# Patient Record
Sex: Male | Born: 1980 | Race: Black or African American | Hispanic: No | Marital: Married | Smoking: Never smoker
Health system: Southern US, Community
[De-identification: ages and names within clinical notes are randomized; demographics above are authoritative.]

## PROBLEM LIST (undated history)

## (undated) DIAGNOSIS — N200 Calculus of kidney: Secondary | ICD-10-CM

## (undated) DIAGNOSIS — E785 Hyperlipidemia, unspecified: Secondary | ICD-10-CM

## (undated) HISTORY — DX: Calculus of kidney: N20.0

## (undated) HISTORY — PX: NO PAST SURGERIES: SHX2092

---

## 2010-12-25 DIAGNOSIS — N200 Calculus of kidney: Secondary | ICD-10-CM

## 2010-12-25 HISTORY — DX: Calculus of kidney: N20.0

## 2011-11-09 ENCOUNTER — Emergency Department (HOSPITAL_COMMUNITY)
Admission: EM | Admit: 2011-11-09 | Discharge: 2011-11-09 | Disposition: A | Discharge status: Home / Self Care | Payer: Self-pay | Attending: Emergency Medicine | Admitting: Emergency Medicine

## 2011-11-09 ENCOUNTER — Emergency Department (HOSPITAL_COMMUNITY): Payer: Self-pay

## 2011-11-09 ENCOUNTER — Encounter: Payer: Self-pay | Admitting: Emergency Medicine

## 2011-11-09 DIAGNOSIS — R1031 Right lower quadrant pain: Secondary | ICD-10-CM | POA: Insufficient documentation

## 2011-11-09 DIAGNOSIS — N201 Calculus of ureter: Secondary | ICD-10-CM | POA: Insufficient documentation

## 2011-11-09 DIAGNOSIS — J45909 Unspecified asthma, uncomplicated: Secondary | ICD-10-CM | POA: Insufficient documentation

## 2011-11-09 DIAGNOSIS — N200 Calculus of kidney: Secondary | ICD-10-CM

## 2011-11-09 LAB — BASIC METABOLIC PANEL
BUN: 8 mg/dL (ref 6–23)
Creatinine, Ser: 1.15 mg/dL (ref 0.50–1.35)
GFR calc Af Amer: >90 mL/min (ref >90)
GFR calc non Af Amer: 85 mL/min — ABNORMAL LOW (ref >90)
Potassium: 3.3 mEq/L — ABNORMAL LOW (ref 3.5–5.1)

## 2011-11-09 LAB — DIFFERENTIAL
Basophils Absolute: 0 10*3/uL (ref 0.0–0.1)
Basophils Relative: 0 % (ref 0–1)
Eosinophils Absolute: 0.2 10*3/uL (ref 0.0–0.7)
Monocytes Relative: 9 % (ref 3–12)
Neutrophils Relative %: 66 % (ref 43–77)

## 2011-11-09 LAB — URINALYSIS, ROUTINE W REFLEX MICROSCOPIC
Bilirubin Urine: NEGATIVE
Ketones, ur: NEGATIVE mg/dL
Nitrite: NEGATIVE
Specific Gravity, Urine: 1.024 (ref 1.005–1.030)
Urobilinogen, UA: 1 mg/dL (ref 0.0–1.0)

## 2011-11-09 LAB — CBC
Hemoglobin: 13.7 g/dL (ref 13.0–17.0)
MCH: 28.7 pg (ref 26.0–34.0)
MCHC: 33.7 g/dL (ref 30.0–36.0)
RDW: 13.6 % (ref 11.5–15.5)

## 2011-11-09 MED ORDER — ONDANSETRON HCL 4 MG/2ML IJ SOLN
4.0000 mg | Freq: Once | INTRAMUSCULAR | Status: AC
  Administered 2011-11-09: 4 mg via INTRAVENOUS
  Filled 2011-11-09: qty 2

## 2011-11-09 MED ORDER — HYDROMORPHONE HCL PF 1 MG/ML IJ SOLN
1.0000 mg | Freq: Once | INTRAMUSCULAR | Status: AC
  Administered 2011-11-09: 1 mg via INTRAVENOUS
  Filled 2011-11-09: qty 1

## 2011-11-09 MED ORDER — ONDANSETRON HCL 4 MG PO TABS: 4.0000 mg | ORAL_TABLET | Freq: Four times a day (QID) | ORAL | Status: AC

## 2011-11-09 MED ORDER — IBUPROFEN 800 MG PO TABS: 800.0000 mg | ORAL_TABLET | Freq: Three times a day (TID) | ORAL | Status: AC

## 2011-11-09 MED ORDER — SODIUM CHLORIDE 0.9 % IV BOLUS (SEPSIS)
500.0000 mL | Freq: Once | INTRAVENOUS | Status: AC
  Administered 2011-11-09: 500 mL via INTRAVENOUS

## 2011-11-09 MED ORDER — KETOROLAC TROMETHAMINE 30 MG/ML IJ SOLN
30.0000 mg | Freq: Once | INTRAMUSCULAR | Status: AC
  Administered 2011-11-09: 30 mg via INTRAVENOUS
  Filled 2011-11-09: qty 1

## 2011-11-09 MED ORDER — OXYCODONE-ACETAMINOPHEN 5-325 MG PO TABS: ORAL_TABLET | ORAL | Status: AC

## 2011-11-09 NOTE — ED Notes (Signed)
Pt states he feel much better, rates pain 1/10, pending disposition

## 2011-11-09 NOTE — ED Provider Notes (Signed)
History     CSN: [id number] Arrival date & time: 11/09/2011 10:03 AM   First MD Initiated Contact with Patient 11/09/11 1006      Chief Complaint  Patient presents with  . Abdominal Pain    RLQ pain started an hour ago, non radiating    (Consider location/radiation/quality/duration/timing/severity/associated sxs/prior treatment) HPI  Patient who has no known medical problems, takes no medicine on a regular basis, and denies any history of abdominal surgeries presents to emergency department by EMS with complaint of acute onset right lower quadrant pain. Patient was given 50 mcg of fentanyl in route by EMS with only mild improvement in pain with ongoing constant pain. Patient states he was at work and had a normal bowel movement, denying pain with bowel movement or blood in his stool and returned to work. He states that approximately 15-20 minutes later he had acute onset right lower quadrant pain. Patient denies aggravating or alleviating factors. Patient denies history of similar pain. He denies fevers, chills, nausea, vomiting, diarrhea, dysuria, hematuria, blood in stool. Patient denies any preceding abdominal pain. He denies testicular pain or penile discharge.  Past Medical History  Diagnosis Date  . Asthma     History reviewed. No pertinent past surgical history.  History reviewed. No pertinent family history.  History  Substance Use Topics  . Smoking status: Never Smoker   . Smokeless tobacco: Not on file  . Alcohol Use: Yes     occassionally      Review of Systems  All other systems reviewed and are negative.    Allergies  Sulfa drugs cross reactors  Home Medications  No current outpatient prescriptions on file.  BP 97/57  Pulse 66  Temp(Src) 97.4 F (36.3 C) (Oral)  Resp 16  Ht 5\' 9"  (1.753 m)  Wt 185 lb (83.915 kg)  BMI 27.32 kg/m2  SpO2 96%  Physical Exam  Nursing note and vitals reviewed. Constitutional: He is oriented to person, place, and  time. He appears well-developed and well-nourished. No distress.  HENT:  Head: Normocephalic and atraumatic.  Eyes: Conjunctivae are normal.  Neck: Normal range of motion. Neck supple.  Cardiovascular: Normal rate, regular rhythm, normal heart sounds and intact distal pulses.  Exam reveals no gallop and no friction rub.   No murmur heard. Pulmonary/Chest: Effort normal and breath sounds normal. No respiratory distress. He has no wheezes. He has no rales. He exhibits no tenderness.  Abdominal: Bowel sounds are normal. He exhibits no distension and no mass. There is no tenderness. There is no rigidity, no rebound, no guarding, no CVA tenderness, no tenderness at McBurney's point and negative Murphy's sign. Hernia confirmed negative in the right inguinal area and confirmed negative in the left inguinal area.  Genitourinary: Testes normal and penis normal. Right testis shows no mass, no swelling and no tenderness. Right testis is descended. Cremasteric reflex is not absent on the right side. Left testis shows no mass, no swelling and no tenderness. Left testis is descended. Cremasteric reflex is not absent on the left side.  Musculoskeletal: Normal range of motion. He exhibits no edema and no tenderness.  Neurological: He is alert and oriented to person, place, and time.  Skin: Skin is warm and dry. No rash noted. He is not diaphoretic. No erythema.  Psychiatric: He has a normal mood and affect.    ED Course  Procedures (including critical care time)  IV Dilaudid and Zofran.  Patient is complaining of ongoing pain however his abdomen is  soft nontender with palpation. No peritoneal signs. No tenderness to palpation of testes bilaterally.  Labs Reviewed  CBC - Abnormal; Notable for the following:    WBC 11.2 (*)    All other components within normal limits  BASIC METABOLIC PANEL - Abnormal; Notable for the following:    Potassium 3.3 (*)    Glucose, Bld 114 (*)    GFR calc non Af Amer 85 (*)     All other components within normal limits  DIFFERENTIAL  URINALYSIS, ROUTINE W REFLEX MICROSCOPIC   Ct Abdomen Pelvis Wo Contrast  11/09/2011  *RADIOLOGY REPORT*  Clinical Data: Right lower quadrant abdominal pain  CT ABDOMEN AND PELVIS WITHOUT CONTRAST  Technique:  Multidetector CT imaging of the abdomen and pelvis was performed following the standard protocol without intravenous contrast.  Comparison: None.  Findings: Lung bases are clear.  No pericardial fluid.  No focal hepatic lesion on this noncontrast exam.  The gallbladder, pancreas, spleen, adrenal glands are normal.  Three nonobstructing calculi within the right kidney measuring 1 to 3 mm.  There is a low density cystic lesion within the mid right renal cortex which cannot be fully characterized (image 32).  There are three nonobstructing calculi within the left kidney ranging in size from 1 to 3 mm.  No evidence of ureterolithiasis on the left. There is a calculus within the right distal ureter approximately 1 cm from the vesicoureteral junction measuring 3 mm (image 76).  The stomach, small bowel appendix, and cecum are normal.  The colon and rectosigmoid colon are normal.  Abdominal aorta normal caliber.  No retroperitoneal lymphadenopathy.  Prostate gland is normal.  No bladder stones.  No pelvic lymphadenopathy.  Review of  bone windows demonstrates no aggressive osseous lesions.   IMPRESSION:  1.  Small partial obstructing calculus within the distal right ureter approximately 1 cm from the ureterovesicular junction. 2.  Bilateral small nonobstructing nephrolithiasis.  Original Report Authenticated By: Genevive Bi, M.D.   After second dose of IV Dilaudid as well as a dose of IV Toradol patient states he is now pain free.  1. Kidney stone       MDM  Pt is afebrile, abdomen soft and nontender with no peritoneal signs. CT scan showing a small kidney stone with no complicating factors. Patient voices understanding to follow up with  urology as needed for ongoing pain but to return to [facility] long emergency room for emergent changing or worsening of symptoms.        Jenness Corner, [place] 11/09/11 1407

## 2011-11-09 NOTE — ED Notes (Signed)
Pt undressed and placed in gown. Pts belongings in bag at the bedside.

## 2011-11-09 NOTE — ED Notes (Signed)
Per ems, pt stated after using bathroom he had an acute onset of abdominal pain (RLQ), pt received with 20g to LFA, pt medicated by ems with 50 mcg of Fentayl for pain 10/10, patient rates pain presently 5/10.

## 2011-11-10 NOTE — ED Provider Notes (Signed)
Medical screening examination/treatment/procedure(s) were performed by non-physician practitioner and as supervising physician I was immediately available for consultation/collaboration.   Wyvonne Carda R Breyton Vanscyoc, MD 11/10/11 1847 

## 2014-03-23 ENCOUNTER — Emergency Department (HOSPITAL_COMMUNITY): Payer: BC Managed Care – PPO

## 2014-03-23 ENCOUNTER — Emergency Department (HOSPITAL_COMMUNITY)
Admission: EM | Admit: 2014-03-23 | Discharge: 2014-03-24 | Disposition: A | Discharge status: Home / Self Care | Payer: BC Managed Care – PPO | Attending: Emergency Medicine | Admitting: Emergency Medicine

## 2014-03-23 ENCOUNTER — Encounter (HOSPITAL_COMMUNITY): Payer: Self-pay | Admitting: Emergency Medicine

## 2014-03-23 DIAGNOSIS — R630 Anorexia: Secondary | ICD-10-CM | POA: Insufficient documentation

## 2014-03-23 DIAGNOSIS — J45909 Unspecified asthma, uncomplicated: Secondary | ICD-10-CM | POA: Insufficient documentation

## 2014-03-23 DIAGNOSIS — E669 Obesity, unspecified: Secondary | ICD-10-CM | POA: Insufficient documentation

## 2014-03-23 DIAGNOSIS — R112 Nausea with vomiting, unspecified: Secondary | ICD-10-CM | POA: Insufficient documentation

## 2014-03-23 DIAGNOSIS — Z79899 Other long term (current) drug therapy: Secondary | ICD-10-CM | POA: Insufficient documentation

## 2014-03-23 DIAGNOSIS — N200 Calculus of kidney: Secondary | ICD-10-CM | POA: Insufficient documentation

## 2014-03-23 LAB — URINALYSIS, ROUTINE W REFLEX MICROSCOPIC
Bilirubin Urine: NEGATIVE
GLUCOSE, UA: NEGATIVE mg/dL
Hgb urine dipstick: NEGATIVE
Ketones, ur: 15 mg/dL — AB
NITRITE: NEGATIVE
PH: 7.5 (ref 5.0–8.0)
Protein, ur: 30 mg/dL — AB
SPECIFIC GRAVITY, URINE: 1.029 (ref 1.005–1.030)
Urobilinogen, UA: 1 mg/dL (ref 0.0–1.0)

## 2014-03-23 LAB — URINE MICROSCOPIC-ADD ON

## 2014-03-23 LAB — COMPREHENSIVE METABOLIC PANEL
ALBUMIN: 4.3 g/dL (ref 3.5–5.2)
ALK PHOS: 43 U/L (ref 39–117)
ALT: 30 U/L (ref 0–53)
AST: 29 U/L (ref 0–37)
BILIRUBIN TOTAL: 0.2 mg/dL — AB (ref 0.3–1.2)
BUN: 10 mg/dL (ref 6–23)
CHLORIDE: 102 meq/L (ref 96–112)
CO2: 24 meq/L (ref 19–32)
CREATININE: 1.47 mg/dL — AB (ref 0.50–1.35)
Calcium: 9.4 mg/dL (ref 8.4–10.5)
GFR calc Af Amer: 71 mL/min — ABNORMAL LOW (ref >90)
GFR, EST NON AFRICAN AMERICAN: 62 mL/min — AB (ref >90)
Glucose, Bld: 109 mg/dL — ABNORMAL HIGH (ref 70–99)
POTASSIUM: 4.6 meq/L (ref 3.7–5.3)
Sodium: 142 mEq/L (ref 137–147)
Total Protein: 7.9 g/dL (ref 6.0–8.3)

## 2014-03-23 LAB — CBC WITH DIFFERENTIAL/PLATELET
BASOS PCT: 0 % (ref 0–1)
Basophils Absolute: 0 10*3/uL (ref 0.0–0.1)
Eosinophils Absolute: 0 10*3/uL (ref 0.0–0.7)
Eosinophils Relative: 0 % (ref 0–5)
HEMATOCRIT: 42 % (ref 39.0–52.0)
HEMOGLOBIN: 14.4 g/dL (ref 13.0–17.0)
LYMPHS PCT: 5 % — AB (ref 12–46)
Lymphs Abs: 0.5 10*3/uL — ABNORMAL LOW (ref 0.7–4.0)
MCH: 29.1 pg (ref 26.0–34.0)
MCHC: 34.3 g/dL (ref 30.0–36.0)
MCV: 84.8 fL (ref 78.0–100.0)
MONO ABS: 0.3 10*3/uL (ref 0.1–1.0)
MONOS PCT: 3 % (ref 3–12)
NEUTROS ABS: 9 10*3/uL — AB (ref 1.7–7.7)
NEUTROS PCT: 92 % — AB (ref 43–77)
Platelets: 259 10*3/uL (ref 150–400)
RBC: 4.95 MIL/uL (ref 4.22–5.81)
RDW: 14.2 % (ref 11.5–15.5)
WBC: 9.8 10*3/uL (ref 4.0–10.5)

## 2014-03-23 MED ORDER — SODIUM CHLORIDE 0.9 % IV BOLUS (SEPSIS)
1000.0000 mL | Freq: Once | INTRAVENOUS | Status: AC
  Administered 2014-03-23: 1000 mL via INTRAVENOUS

## 2014-03-23 MED ORDER — OXYCODONE-ACETAMINOPHEN 5-325 MG PO TABS
1.0000 | ORAL_TABLET | Freq: Once | ORAL | Status: AC
  Administered 2014-03-23: 1 via ORAL
  Filled 2014-03-23: qty 1

## 2014-03-23 MED ORDER — HYDROMORPHONE HCL PF 1 MG/ML IJ SOLN
1.0000 mg | Freq: Once | INTRAMUSCULAR | Status: AC
  Administered 2014-03-23: 1 mg via INTRAVENOUS
  Filled 2014-03-23: qty 1

## 2014-03-23 MED ORDER — KETOROLAC TROMETHAMINE 30 MG/ML IJ SOLN
30.0000 mg | Freq: Once | INTRAMUSCULAR | Status: AC
  Administered 2014-03-23: 30 mg via INTRAVENOUS
  Filled 2014-03-23: qty 1

## 2014-03-23 MED ORDER — ONDANSETRON 4 MG PO TBDP
8.0000 mg | ORAL_TABLET | Freq: Once | ORAL | Status: AC
  Administered 2014-03-23: 8 mg via ORAL
  Filled 2014-03-23: qty 2

## 2014-03-23 NOTE — ED Notes (Signed)
Updated pt taken to Triage 4 for reassessment

## 2014-03-23 NOTE — ED Provider Notes (Signed)
CSN: [id number]     Arrival date & time 03/23/14  1439 History   First MD Initiated Contact with Patient 03/23/14 2014     Chief Complaint  Patient presents with  . Nephrolithiasis     (Consider location/radiation/quality/duration/timing/severity/associated sxs/prior Treatment) Patient is a 33 y.o. male presenting with abdominal pain. The history is provided by the patient.  Abdominal Pain Pain location:  L flank (L groin) Pain quality: sharp   Pain radiates to:  Does not radiate Pain severity:  Mild Onset quality:  Sudden Duration:  6 hours Timing:  Intermittent Progression:  Waxing and waning Chronicity:  Recurrent (states feels similar to Kidney stone pain he had 2 yearsa go) Context: not alcohol use, not diet changes, not medication withdrawal, not previous surgeries, not recent travel, not sick contacts and not trauma   Relieved by:  Nothing Worsened by:  Nothing tried Ineffective treatments:  None tried Associated symptoms: anorexia, nausea and vomiting   Associated symptoms: no chest pain, no chills, no constipation, no cough, no diarrhea, no dysuria, no fever, no hematuria, no shortness of breath and no sore throat   Associated symptoms comment:  Denies penis or testicle pain Risk factors: obesity   Risk factors: no alcohol abuse, not elderly, has not had multiple surgeries and no recent hospitalization     Past Medical History  Diagnosis Date  . Asthma    History reviewed. No pertinent past surgical history. History reviewed. No pertinent family history. History  Substance Use Topics  . Smoking status: Never Smoker   . Smokeless tobacco: Not on file  . Alcohol Use: Yes     Comment: occassionally    Review of Systems  Constitutional: Negative for fever, chills, activity change and appetite change.  HENT: Negative for congestion, rhinorrhea and sore throat.   Eyes: Negative for discharge and itching.  Respiratory: Negative for cough, shortness of breath and  wheezing.   Cardiovascular: Negative for chest pain.  Gastrointestinal: Positive for nausea, vomiting, abdominal pain and anorexia. Negative for diarrhea and constipation.  Genitourinary: Negative for dysuria, hematuria, decreased urine volume and difficulty urinating.  Skin: Negative for rash and wound.  Neurological: Negative for syncope, weakness and numbness.  All other systems reviewed and are negative.      Allergies  Sulfa drugs cross reactors  Home Medications   Current Outpatient Rx  Name  Route  Sig  Dispense  Refill  . ondansetron (ZOFRAN ODT) 4 MG disintegrating tablet   Oral   Take 1 tablet (4 mg total) by mouth every 8 (eight) hours as needed for nausea or vomiting.   30 tablet   0   . oxyCODONE-acetaminophen (PERCOCET) 7.5-325 MG per tablet   Oral   Take 1 tablet by mouth every 4 (four) hours as needed for pain.   30 tablet   0   . tamsulosin (FLOMAX) 0.4 MG CAPS capsule   Oral   Take 1 capsule (0.4 mg total) by mouth daily.   30 capsule   0    BP 106/66  Pulse 77  Temp(Src) 98 F (36.7 C)  Resp 20  SpO2 93% Physical Exam  Vitals reviewed. Constitutional: He is oriented to person, place, and time. He appears well-developed and well-nourished. No distress.  Obese male in NAD  HENT:  Head: Normocephalic and atraumatic.  Mouth/Throat: Oropharynx is clear and moist. No oropharyngeal exudate.  Eyes: Conjunctivae and EOM are normal. Pupils are equal, round, and reactive to light. Right eye exhibits no discharge.  Left eye exhibits no discharge. No scleral icterus.  Neck: Normal range of motion. Neck supple.  Cardiovascular: Normal rate, regular rhythm, normal heart sounds and intact distal pulses.  Exam reveals no gallop and no friction rub.   No murmur heard. Pulmonary/Chest: Effort normal and breath sounds normal. No respiratory distress. He has no wheezes. He has no rales.  Abdominal: Soft. He exhibits no distension and no mass. There is tenderness  (mild anterior L abd ttp, soft, no rigidity, neg mcburneys, neg murphys, no CVA ttp, no peritonitis).  Musculoskeletal: Normal range of motion.  Neurological: He is alert and oriented to person, place, and time. No cranial nerve deficit. He exhibits normal muscle tone. Coordination normal.  Skin: Skin is warm. No rash noted. He is not diaphoretic.    ED Course  Procedures (including critical care time) Labs Review Labs Reviewed  URINALYSIS, ROUTINE W REFLEX MICROSCOPIC - Abnormal; Notable for the following:    Ketones, ur 15 (*)    Protein, ur 30 (*)    Leukocytes, UA TRACE (*)    All other components within normal limits  CBC WITH DIFFERENTIAL - Abnormal; Notable for the following:    Neutrophils Relative % 92 (*)    Neutro Abs 9.0 (*)    Lymphocytes Relative 5 (*)    Lymphs Abs 0.5 (*)    All other components within normal limits  COMPREHENSIVE METABOLIC PANEL - Abnormal; Notable for the following:    Glucose, Bld 109 (*)    Creatinine, Ser 1.47 (*)    Total Bilirubin 0.2 (*)    GFR calc non Af Amer 62 (*)    GFR calc Af Amer 71 (*)    All other components within normal limits  URINE MICROSCOPIC-ADD ON   Imaging Review Ct Abdomen Pelvis Wo Contrast  03/23/2014   CLINICAL DATA:  Flank pain; history of renal calculi  EXAM: CT ABDOMEN AND PELVIS WITHOUT CONTRAST  TECHNIQUE: Multidetector CT imaging of the abdomen and pelvis was performed following the standard protocol without oral intravenous contrast material administration.  COMPARISON:  November 09, 2011  FINDINGS: On axial slice 12 series 3, there is a 2 mm nodular opacity in the lateral segment of the left lower lobe. Lung bases are otherwise clear.  There is evidence of fatty change in the liver. No focal liver lesions are identified on this noncontrast enhanced study. There is no appreciable biliary duct dilatation. Gallbladder wall does not appear thickened.  Spleen, pancreas, and adrenals appear normal.  There is a 5 x 4 mm  calculus in the upper pole of the right kidney. There are several 1-2 mm calculi in the mid right kidney. There is a 2 mm calculus in the lower pole of the right kidney. There is a focal area of decreased attenuation in the periphery of the mid right kidney measuring 1.2 x 0.9 cm which has attenuation values slightly higher than expected for simple cyst. There is an 8 x 8 mm simple cyst in the lower pole of the right kidney. There is no hydronephrosis on the right. On the left, there is a nonobstructing 5 x 4 mm calculus in the mid to lower pole region. There is no left renal mass. There is mild hydronephrosis on the left. There is a 3 mm calculus in the distal left ureter best seen on slice 80 series 2. There are several nearby phleboliths on the left which are near but separate from the left ureter.  In the pelvis, the  urinary bladder is midline with normal wall thickness. There is no pelvic mass or fluid collection. Appendix appears within normal limits.  There is no bowel obstruction.  No free air or portal venous air.  There is a small ventral hernia containing only fat.  There is no ascites, adenopathy, or abscess in the abdomen or pelvis. There is no evidence of abdominal aortic aneurysm. There are no blastic or lytic bone lesions.  IMPRESSION: 3 mm calculus distal left ureter causing mild hydronephrosis on the left. No other ureteral calculi on either side.  There are nonobstructing calculi in each kidney, more on the right than on the left.  There is a small mass in the mid right kidney which has attenuation values slightly higher than is expected with simple cyst. Renal ultrasound advised to further evaluate.  There is fatty liver.  There is a 2 mm nodular opacity in the lateral segment of the left lower lobe. This opacity should be followed based on Fleischner Society guidelines. If the patient is at high risk for bronchogenic carcinoma, follow-up chest CT at 1 year is recommended. If the patient is at  low risk, no follow-up is needed. This recommendation follows the consensus statement: Guidelines for Management of Small Pulmonary Nodules Detected on CT Scans: A Statement from the Fleischner Society as published in Radiology 2005; 237:395-400.  No bowel obstruction.  No abscess.  Appendix appears normal.   Electronically Signed   By: Bretta Bang M.D.   On: 03/23/2014 19:09     EKG Interpretation None      MDM   MDM: 33 y.o. AAM w/ PMHx of kidney stone with L abd and flank pain for past 6 hours. Pt sudden onset, sharp, intermittent. Pt with n/v as well, states feels similar to past kidney stones. Denies urinary sxs, f/c, chest pain,S OB. No GU pain. AFVSS, well appaering but in pain. LAbs, urine, CT scan obtained. Given initially toradol, then dilaudid with good relief of pain. Zofran given with no vomitng while in ED. Given 2 L IVF as well. CT shows 3 mm stone with mild hydro. BMP with Cr of 1.4, but patient clinically dry, will hydrate. Pt making good urine while in Ed. Urine not infected. No fever, no infected urine to suggest infected stone. Small stone, will liekly pass. Recommend hydration, pain and nausea control and ureteral dilation. Given flomax, percocet, zofran and recommend to drink large amount of water. Given Urology f/u. Pt feels better, will discahrge. Care of case d/w my attending.  Final diagnoses:  Kidney stone on left side    Discharged   Pilar Jarvis, MD 03/24/14 [phone]

## 2014-03-23 NOTE — ED Notes (Signed)
Per pt sts about 2 hours ago started having left flank/groin pain. sts similar to in the pain when he had a kidney stone. Denies N,V. Denies blood in urine.

## 2014-03-24 MED ORDER — HYDROMORPHONE HCL PF 1 MG/ML IJ SOLN
1.0000 mg | Freq: Once | INTRAMUSCULAR | Status: AC
  Administered 2014-03-24: 1 mg via INTRAVENOUS
  Filled 2014-03-24: qty 1

## 2014-03-24 MED ORDER — TAMSULOSIN HCL 0.4 MG PO CAPS: 0.4000 mg | ORAL_CAPSULE | Freq: Every day | ORAL | Status: DC

## 2014-03-24 MED ORDER — OXYCODONE-ACETAMINOPHEN 7.5-325 MG PO TABS: 1.0000 | ORAL_TABLET | ORAL | Status: DC | PRN

## 2014-03-24 MED ORDER — ONDANSETRON 4 MG PO TBDP: 4.0000 mg | ORAL_TABLET | Freq: Three times a day (TID) | ORAL | Status: DC | PRN

## 2014-03-24 NOTE — ED Provider Notes (Signed)
This patient was seen in conjunction with the resident physician, Dr. Marcha SoldersBrtalik.  The documentation accurately reflects the patient's ED course.  On my exam the patient patient was improving, having received fluids, analgesia.  He was in no distress, hemodynamically stable.  I had a lengthy conversation with him and his companion about kidney stones.  He was appropriate for discharged with urology followup.  Gerhard Munchobert Keonta Alsip, MD 03/24/14 [phone]

## 2014-03-24 NOTE — Discharge Instructions (Signed)
Kidney Stones  Kidney stones (urolithiasis) are deposits that form inside your kidneys. The intense pain is caused by the stone moving through the urinary tract. When the stone moves, the ureter goes into spasm around the stone. The stone is usually passed in the urine.   CAUSES   · A disorder that makes certain neck glands produce too much parathyroid hormone (primary hyperparathyroidism).  · A buildup of uric acid crystals, similar to gout in your joints.  · Narrowing (stricture) of the ureter.  · A kidney obstruction present at birth (congenital obstruction).  · Previous surgery on the kidney or ureters.  · Numerous kidney infections.  SYMPTOMS   · Feeling sick to your stomach (nauseous).  · Throwing up (vomiting).  · Blood in the urine (hematuria).  · Pain that usually spreads (radiates) to the groin.  · Frequency or urgency of urination.  DIAGNOSIS   · Taking a history and physical exam.  · Blood or urine tests.  · CT scan.  · Occasionally, an examination of the inside of the urinary bladder (cystoscopy) is performed.  TREATMENT   · Observation.  · Increasing your fluid intake.  · Extracorporeal shock wave lithotripsy This is a noninvasive procedure that uses shock waves to break up kidney stones.  · Surgery may be needed if you have severe pain or persistent obstruction. There are various surgical procedures. Most of the procedures are performed with the use of small instruments. Only small incisions are needed to accommodate these instruments, so recovery time is minimized.  The size, location, and chemical composition are all important variables that will determine the proper choice of action for you. Talk to your health care provider to better understand your situation so that you will minimize the risk of injury to yourself and your kidney.   HOME CARE INSTRUCTIONS   · Drink enough water and fluids to keep your urine clear or pale yellow. This will help you to pass the stone or stone fragments.  · Strain  all urine through the provided strainer. Keep all particulate matter and stones for your health care provider to see. The stone causing the pain may be as small as a grain of salt. It is very important to use the strainer each and every time you pass your urine. The collection of your stone will allow your health care provider to analyze it and verify that a stone has actually passed. The stone analysis will often identify what you can do to reduce the incidence of recurrences.  · Only take over-the-counter or prescription medicines for pain, discomfort, or fever as directed by your health care provider.  · Make a follow-up appointment with your health care provider as directed.  · Get follow-up X-rays if required. The absence of pain does not always mean that the stone has passed. It may have only stopped moving. If the urine remains completely obstructed, it can cause loss of kidney function or even complete destruction of the kidney. It is your responsibility to make sure X-rays and follow-ups are completed. Ultrasounds of the kidney can show blockages and the status of the kidney. Ultrasounds are not associated with any radiation and can be performed easily in a matter of minutes.  SEEK MEDICAL CARE IF:  · You experience pain that is progressive and unresponsive to any pain medicine you have been prescribed.  SEEK IMMEDIATE MEDICAL CARE IF:   · Pain cannot be controlled with the prescribed medicine.  · You have a fever   or shaking chills.  · The severity or intensity of pain increases over 18 hours and is not relieved by pain medicine.  · You develop a new onset of abdominal pain.  · You feel faint or pass out.  · You are unable to urinate.  MAKE SURE YOU:   · Understand these instructions.  · Will watch your condition.  · Will get help right away if you are not doing well or get worse.  Document Released: 12/11/2005 Document Revised: 08/13/2013 Document Reviewed: 05/14/2013  [employer]® Patient Information ©2014  [employer].

## 2015-02-23 ENCOUNTER — Encounter: Payer: Self-pay | Admitting: Physician Assistant

## 2015-02-23 ENCOUNTER — Ambulatory Visit (INDEPENDENT_AMBULATORY_CARE_PROVIDER_SITE_OTHER): Payer: 59 | Admitting: Physician Assistant

## 2015-02-23 VITALS — BP 122/74 | HR 73 | Temp 98.9°F | Resp 16 | Ht 69.0 in | Wt 204.2 lb

## 2015-02-23 DIAGNOSIS — G479 Sleep disorder, unspecified: Secondary | ICD-10-CM

## 2015-02-23 NOTE — Progress Notes (Signed)
Pre visit review using our clinic review tool, if applicable. No additional management support is needed unless otherwise documented below in the visit note/SLS  

## 2015-02-23 NOTE — Progress Notes (Signed)
   Patient presents to clinic today to establish care.  Acute Concerns: Endorses irregular sleep cycle with work that was resulting in fatigue.  Has recently started melatonin (about 2 days ago) in hopes that this will help his sleeping. Patient denies increased stress or anxiety as potential causes. Denies prior history of insomnia. Has not taken anything else for his symptoms.  Past Medical History  Diagnosis Date  . Asthma     Childhood -- Resolved.  . Kidney stones 2012    Past Surgical History  Procedure Laterality Date  . No past surgeries      No current outpatient prescriptions on file prior to visit.   No current facility-administered medications on file prior to visit.    Allergies  Allergen Reactions  . Sulfa Drugs Cross Reactors Rash    Rash    Family History  Problem Relation Age of Onset  . Healthy Mother     Living  . Healthy Father     Living  . Diabetes Maternal Grandmother   . Alzheimer's disease Paternal Grandfather   . Stroke Paternal Grandmother   . Diabetes Maternal Uncle   . Healthy Brother     History   Social History  . Marital Status: Single    Spouse Name: N/A  . Number of Children: N/A  . Years of Education: N/A   Occupational History  . Not on file.   Social History Main Topics  . Smoking status: Never Smoker   . Smokeless tobacco: Never Used  . Alcohol Use: 0.0 oz/week    0 Standard drinks or equivalent per week     Comment: occassionally  . Drug Use: No  . Sexual Activity:    Partners: Female   Other Topics Concern  . Not on file   Social History Narrative   ROS Pertinent ROS are listed in the HPI.   BP 122/74 mmHg  Pulse 73  Temp(Src) 98.9 F (37.2 C) (Oral)  Resp 16  Ht 5\' 9"  (1.753 m)  Wt 204 lb 4 oz (92.647 kg)  BMI 30.15 kg/m2  SpO2 99%  Physical Exam  Constitutional: He is well-developed, well-nourished, and in no distress.  HENT:  Head: Normocephalic and atraumatic.  Eyes: Conjunctivae are  normal.  Neck: Neck supple.  Cardiovascular: Normal rate, regular rhythm, normal heart sounds and intact distal pulses.   Pulmonary/Chest: Effort normal and breath sounds normal. No respiratory distress. He has no wheezes. He has no rales. He exhibits no tenderness.  Vitals reviewed.  Assessment/Plan: Sleeping difficulty Patient with a hectic and changing schedule. This is likely the culprit of his symptoms. Encouraged patient to get melatonin a few weeks to start working in his body. Also encouraged increased p.m. Exercise as this  permits restful sleep.  Follow-up if not improving. We can then discuss prescription medications if indicated.

## 2015-02-23 NOTE — Patient Instructions (Signed)
Please continue the Melatonin nightly.  It will take a few weeks before you will notice a difference.  Increase evening exercise as this promotes restful sleep. Let me know if things are not improving.  Please schedule an appointment for a complete physical.  Melatonin oral capsules and tablets What is this medicine? MELATONIN (mel uh TOH nin) is a dietary supplement. It is promoted to help maintain normal sleep patterns. The FDA has not approved this supplement for any medical use. This supplement may be used for other purposes; ask your health care provider or pharmacist if you have questions. This medicine may be used for other purposes; ask your health care provider or pharmacist if you have questions. COMMON BRAND NAME(S): Melatonex What should I tell my health care provider before I take this medicine? They need to know if you have any of these conditions: -cancer -if you frequently drink alcohol containing drinks -immune system problems -liver disease -seizure disorder -an unusual or allergic reaction to melatonin, other medicines, foods, dyes, or preservatives -pregnant or trying to get pregnant -breast-feeding How should I use this medicine? Take this supplement by mouth with a glass of water. This supplement is usually taken prior to bedtime. Do not chew or crush most tablets or capsules. Some tablets are chewable and are chewed before swallowing. Some tablets are meant to be dissolved in the mouth or under the tongue. Follow the directions on the package labeling, or take as directed by your health care professional. Do not take this supplement more often than directed. Talk to your pediatrician regarding the use of this supplement in children. This supplement is not recommended for use in children. Overdosage: If you think you have taken too much of this medicine contact a poison control center or emergency room at once. NOTE: This medicine is only for you. Do not share this  medicine with others. What if I miss a dose? This does not apply; this medicine is not for regular use. Do not take double or extra doses. What may interact with this medicine? Check with your doctor or healthcare professional if you are taking any of the following medications: -hormone medicines -medicines for blood pressure like nifedipine -medications for anxiety, depression, or other emotional or psychiatric problems -medications for seizures -medications for sleep -other herbal or dietary supplements -tamoxifen -treatments for cancer or immune disorders This list may not describe all possible interactions. Give your health care provider a list of all the medicines, herbs, non-prescription drugs, or dietary supplements you use. Also tell them if you smoke, drink alcohol, or use illegal drugs. Some items may interact with your medicine. What should I watch for while using this medicine? See your doctor if your symptoms do not get better or if they get worse. Do not take this supplement for more than 2 weeks unless your doctor tells you to. You may get drowsy or dizzy. Do not drive, use machinery, or do anything that needs mental alertness until you know how this medicine affects you. Do not stand or sit up quickly, especially if you are an older patient. This reduces the risk of dizzy or fainting spells. Alcohol may interfere with the effect of this medicine. Avoid alcoholic drinks. Talk to your doctor before you use this supplement if you are currently being treated for an emotional, mental, or sleep problem. This medicine may interfere with your treatment. Herbal or dietary supplements are not regulated like medicines. Rigid quality control standards are not required for dietary supplements.  The purity and strength of these products can vary. The safety and effect of this dietary supplement for a certain disease or illness is not well known. This product is not intended to diagnose, treat,  cure or prevent any disease. The Food and Drug Administration suggests the following to help consumers protect themselves: -Always read product labels and follow directions. -Natural does not mean a product is safe for humans to take. -Look for products that include USP after the ingredient name. This means that the manufacturer followed the standards of the [place] Pharmacopoeia. -Supplements made or sold by a nationally known food or drug company are more likely to be made under tight controls. You can write to the company for more information about how the product was made. What side effects may I notice from receiving this medicine? Side effects that you should report to your doctor or health care professional as soon as possible: -allergic reactions like skin rash, itching or hives, swelling of the face, lips, or tongue -breathing problems -confusion, forgetful -depressed, nervous, or other mood changes -fast or pounding heartbeat -trouble staying awake or alert during the day Side effects that usually do not require medical attention (report to your doctor or health care professional if they continue or are bothersome): -drowsiness, dizziness -headache -nightmares -upset stomach This list may not describe all possible side effects. Call your doctor for medical advice about side effects. You may report side effects to FDA at 1-800-FDA-1088. Where should I keep my medicine? Keep out of the reach of children. Store at room temperature or as directed on the package label. Protect from moisture. Throw away any unused supplement after the expiration date. NOTE: This sheet is a summary. It may not cover all possible information. If you have questions about this medicine, talk to your doctor, pharmacist, or health care provider.  2015, [employer] Standard. (2013-10-24 18:36:27)

## 2015-02-24 DIAGNOSIS — G479 Sleep disorder, unspecified: Secondary | ICD-10-CM | POA: Insufficient documentation

## 2015-02-24 NOTE — Assessment & Plan Note (Signed)
Patient with a hectic and changing schedule. This is likely the culprit of his symptoms. Encouraged patient to get melatonin a few weeks to start working in his body. Also encouraged increased p.m. Exercise as this  permits restful sleep.  Follow-up if not improving. We can then discuss prescription medications if indicated.

## 2015-03-23 ENCOUNTER — Telehealth: Payer: Self-pay | Admitting: Physician Assistant

## 2015-03-23 NOTE — Telephone Encounter (Signed)
Pre visit letter sent  °

## 2015-04-12 ENCOUNTER — Telehealth: Payer: Self-pay | Admitting: *Deleted

## 2015-04-12 NOTE — Telephone Encounter (Signed)
Unable to reach patient at time of Pre-Visit Call.  Left message for patient to return call when available.    

## 2015-04-13 ENCOUNTER — Ambulatory Visit (INDEPENDENT_AMBULATORY_CARE_PROVIDER_SITE_OTHER): Payer: 59 | Admitting: Physician Assistant

## 2015-04-13 ENCOUNTER — Encounter: Payer: Self-pay | Admitting: Physician Assistant

## 2015-04-13 VITALS — BP 112/68 | HR 79 | Temp 99.8°F | Resp 16 | Ht 69.0 in | Wt 233.5 lb

## 2015-04-13 DIAGNOSIS — Z23 Encounter for immunization: Secondary | ICD-10-CM

## 2015-04-13 DIAGNOSIS — Z0000 Encounter for general adult medical examination without abnormal findings: Secondary | ICD-10-CM

## 2015-04-13 NOTE — Assessment & Plan Note (Signed)
Depression screen negative. Health Maintenance reviewed. TDaP given by nursing. Previous HIV screen performed per patient. Handout given.  Will obtain fasting labs today.

## 2015-04-13 NOTE — Addendum Note (Signed)
Addended by: Regis BillSCATES, SHARON L on: 04/13/2015 02:29 PM   Modules accepted: Orders

## 2015-04-13 NOTE — Progress Notes (Signed)
Patient presents to clinic today for annual exam.  Patient is fasting for labs.  Acute Concerns: No acute concerns  Chronic Issues: None to review today.  Health Maintenance: Dental -- 3 weeks ago. Vision -- No recent opthamologist; floaters.   Immunizations -- Unsure of Tetanus. Will be getting TDaP.   Past Medical History  Diagnosis Date  . Asthma     Childhood -- Resolved.  . Kidney stones 2012    Past Surgical History  Procedure Laterality Date  . No past surgeries      Current Outpatient Prescriptions on File Prior to Visit  Medication Sig Dispense Refill  . Melatonin 1 MG CAPS 1 tablet by mouth nightly  0   No current facility-administered medications on file prior to visit.    Allergies  Allergen Reactions  . Sulfa Drugs Cross Reactors Rash    Rash    Family History  Problem Relation Age of Onset  . Healthy Mother     Living  . Healthy Father     Living  . Diabetes Maternal Grandmother   . Alzheimer's disease Paternal Grandfather   . Stroke Paternal Grandmother   . Diabetes Maternal Uncle   . Healthy Brother     History   Social History  . Marital Status: Single    Spouse Name: N/A  . Number of Children: N/A  . Years of Education: N/A   Occupational History  . Not on file.   Social History Main Topics  . Smoking status: Never Smoker   . Smokeless tobacco: Never Used  . Alcohol Use: 0.0 oz/week    0 Standard drinks or equivalent per week     Comment: occassionally  . Drug Use: No  . Sexual Activity:    Partners: Female   Other Topics Concern  . Not on file   Social History Narrative   Review of Systems  Constitutional: Negative for fever, chills and weight loss.  HENT: Negative for congestion and hearing loss.   Eyes: Negative for blurred vision and double vision.  Respiratory: Negative for cough and shortness of breath.   Cardiovascular: Negative for chest pain and palpitations.  Gastrointestinal: Negative for diarrhea  and constipation.  Genitourinary: Negative.   Musculoskeletal: Negative.   Neurological: Negative for headaches.  Psychiatric/Behavioral: Negative.    BP 112/68 mmHg  Pulse 79  Temp(Src) 99.8 F (37.7 C) (Oral)  Resp 16  Ht 5\' 9"  (1.753 m)  Wt 233 lb 8 oz (105.915 kg)  BMI 34.47 kg/m2  SpO2 98%  Physical Exam  Constitutional: He is oriented to person, place, and time and well-developed, well-nourished, and in no distress.  HENT:  Head: Normocephalic and atraumatic.  Right Ear: External ear normal.  Left Ear: External ear normal.  Nose: Nose normal.  Mouth/Throat: Oropharynx is clear and moist. No oropharyngeal exudate.  Eyes: Conjunctivae and EOM are normal. Pupils are equal, round, and reactive to light.  Neck: Neck supple. No thyromegaly present.  Cardiovascular: Normal rate, regular rhythm, normal heart sounds and intact distal pulses.   Pulmonary/Chest: Effort normal and breath sounds normal. No respiratory distress. He has no wheezes. He has no rales. He exhibits no tenderness.  Abdominal: Soft. Bowel sounds are normal. He exhibits no distension and no mass. There is no tenderness. There is no rebound and no guarding.  Genitourinary: Testes/scrotum normal.  Lymphadenopathy:    He has no cervical adenopathy.  Neurological: He is alert and oriented to person, place, and time.  Skin:  Skin is warm and dry. No rash noted.  Psychiatric: Affect normal.  Vitals reviewed.  Assessment/Plan: Visit for preventive health examination Depression screen negative. Health Maintenance reviewed. TDaP given by nursing. Previous HIV screen performed per patient. Handout given.  Will obtain fasting labs today.

## 2015-04-13 NOTE — Progress Notes (Signed)
Pre visit review using our clinic review tool, if applicable. No additional management support is needed unless otherwise documented below in the visit note/SLS  

## 2015-04-13 NOTE — Patient Instructions (Signed)
Please go to lab for blood work. I will call you with your results.  Preventive Care for Adults A healthy lifestyle and preventive care can promote health and wellness. Preventive health guidelines for men include the following key practices:  A routine yearly physical is a good way to check with your health care provider about your health and preventative screening. It is a chance to share any concerns and updates on your health and to receive a thorough exam.  Visit your dentist for a routine exam and preventative care every 6 months. Brush your teeth twice a day and floss once a day. Good oral hygiene prevents tooth decay and gum disease.  The frequency of eye exams is based on your age, health, family medical history, use of contact lenses, and other factors. Follow your health care provider's recommendations for frequency of eye exams.  Eat a healthy diet. Foods such as vegetables, fruits, whole grains, low-fat dairy products, and lean protein foods contain the nutrients you need without too many calories. Decrease your intake of foods high in solid fats, added sugars, and salt. Eat the right amount of calories for you.Get information about a proper diet from your health care provider, if necessary.  Regular physical exercise is one of the most important things you can do for your health. Most adults should get at least 150 minutes of moderate-intensity exercise (any activity that increases your heart rate and causes you to sweat) each week. In addition, most adults need muscle-strengthening exercises on 2 or more days a week.  Maintain a healthy weight. The body mass index (BMI) is a screening tool to identify possible weight problems. It provides an estimate of body fat based on height and weight. Your health care provider can find your BMI and can help you achieve or maintain a healthy weight.For adults 20 years and older:  A BMI below 18.5 is considered underweight.  A BMI of 18.5 to  24.9 is normal.  A BMI of 25 to 29.9 is considered overweight.  A BMI of 30 and above is considered obese.  Maintain normal blood lipids and cholesterol levels by exercising and minimizing your intake of saturated fat. Eat a balanced diet with plenty of fruit and vegetables. Blood tests for lipids and cholesterol should begin at age 42 and be repeated every 5 years. If your lipid or cholesterol levels are high, you are over 50, or you are at high risk for heart disease, you may need your cholesterol levels checked more frequently.Ongoing high lipid and cholesterol levels should be treated with medicines if diet and exercise are not working.  If you smoke, find out from your health care provider how to quit. If you do not use tobacco, do not start.  Lung cancer screening is recommended for adults aged 5-80 years who are at high risk for developing lung cancer because of a history of smoking. A yearly low-dose CT scan of the lungs is recommended for people who have at least a 30-pack-year history of smoking and are a current smoker or have quit within the past 15 years. A pack year of smoking is smoking an average of 1 pack of cigarettes a day for 1 year (for example: 1 pack a day for 30 years or 2 packs a day for 15 years). Yearly screening should continue until the smoker has stopped smoking for at least 15 years. Yearly screening should be stopped for people who develop a health problem that would prevent them from having  lung cancer treatment.  If you choose to drink alcohol, do not have more than 2 drinks per day. One drink is considered to be 12 ounces (355 mL) of beer, 5 ounces (148 mL) of wine, or 1.5 ounces (44 mL) of liquor.  Avoid use of street drugs. Do not share needles with anyone. Ask for help if you need support or instructions about stopping the use of drugs.  High blood pressure causes heart disease and increases the risk of stroke. Your blood pressure should be checked at least  every 1-2 years. Ongoing high blood pressure should be treated with medicines, if weight loss and exercise are not effective.  If you are 44-71 years old, ask your health care provider if you should take aspirin to prevent heart disease.  Diabetes screening involves taking a blood sample to check your fasting blood sugar level. This should be done once every 3 years, after age 35, if you are within normal weight and without risk factors for diabetes. Testing should be considered at a younger age or be carried out more frequently if you are overweight and have at least 1 risk factor for diabetes.  Colorectal cancer can be detected and often prevented. Most routine colorectal cancer screening begins at the age of 65 and continues through age 86. However, your health care provider may recommend screening at an earlier age if you have risk factors for colon cancer. On a yearly basis, your health care provider may provide home test kits to check for hidden blood in the stool. Use of a small camera at the end of a tube to directly examine the colon (sigmoidoscopy or colonoscopy) can detect the earliest forms of colorectal cancer. Talk to your health care provider about this at age 104, when routine screening begins. Direct exam of the colon should be repeated every 5-10 years through age 7, unless early forms of precancerous polyps or small growths are found.  People who are at an increased risk for hepatitis B should be screened for this virus. You are considered at high risk for hepatitis B if:  You were born in a country where hepatitis B occurs often. Talk with your health care provider about which countries are considered high risk.  Your parents were born in a high-risk country and you have not received a shot to protect against hepatitis B (hepatitis B vaccine).  You have HIV or AIDS.  You use needles to inject street drugs.  You live with, or have sex with, someone who has hepatitis B.  You are  a man who has sex with other men (MSM).  You get hemodialysis treatment.  You take certain medicines for conditions such as cancer, organ transplantation, and autoimmune conditions.  Hepatitis C blood testing is recommended for all people born from [birth year] through 1965 and any individual with known risks for hepatitis C.  Practice safe sex. Use condoms and avoid high-risk sexual practices to reduce the spread of sexually transmitted infections (STIs). STIs include gonorrhea, chlamydia, syphilis, trichomonas, herpes, HPV, and human immunodeficiency virus (HIV). Herpes, HIV, and HPV are viral illnesses that have no cure. They can result in disability, cancer, and death.  If you are at risk of being infected with HIV, it is recommended that you take a prescription medicine daily to prevent HIV infection. This is called preexposure prophylaxis (PrEP). You are considered at risk if:  You are a man who has sex with other men (MSM) and have other risk factors.  You  are a heterosexual man, are sexually active, and are at increased risk for HIV infection.  You take drugs by injection.  You are sexually active with a partner who has HIV.  Talk with your health care provider about whether you are at high risk of being infected with HIV. If you choose to begin PrEP, you should first be tested for HIV. You should then be tested every 3 months for as long as you are taking PrEP.  A one-time screening for abdominal aortic aneurysm (AAA) and surgical repair of large AAAs by ultrasound are recommended for men ages 103 to 57 years who are current or former smokers.  Healthy men should no longer receive prostate-specific antigen (PSA) blood tests as part of routine cancer screening. Talk with your health care provider about prostate cancer screening.  Testicular cancer screening is not recommended for adult males who have no symptoms. Screening includes self-exam, a health care provider exam, and other screening  tests. Consult with your health care provider about any symptoms you have or any concerns you have about testicular cancer.  Use sunscreen. Apply sunscreen liberally and repeatedly throughout the day. You should seek shade when your shadow is shorter than you. Protect yourself by wearing long sleeves, pants, a wide-brimmed hat, and sunglasses year round, whenever you are outdoors.  Once a month, do a whole-body skin exam, using a mirror to look at the skin on your back. Tell your health care provider about new moles, moles that have irregular borders, moles that are larger than a pencil eraser, or moles that have changed in shape or color.  Stay current with required vaccines (immunizations).  Influenza vaccine. All adults should be immunized every year.  Tetanus, diphtheria, and acellular pertussis (Td, Tdap) vaccine. An adult who has not previously received Tdap or who does not know his vaccine status should receive 1 dose of Tdap. This initial dose should be followed by tetanus and diphtheria toxoids (Td) booster doses every 10 years. Adults with an unknown or incomplete history of completing a 3-dose immunization series with Td-containing vaccines should begin or complete a primary immunization series including a Tdap dose. Adults should receive a Td booster every 10 years.  Varicella vaccine. An adult without evidence of immunity to varicella should receive 2 doses or a second dose if he has previously received 1 dose.  Human papillomavirus (HPV) vaccine. Males aged 56-21 years who have not received the vaccine previously should receive the 3-dose series. Males aged 22-26 years may be immunized. Immunization is recommended through the age of 96 years for any male who has sex with males and did not get any or all doses earlier. Immunization is recommended for any person with an immunocompromised condition through the age of 79 years if he did not get any or all doses earlier. During the 3-dose  series, the second dose should be obtained 4-8 weeks after the first dose. The third dose should be obtained 24 weeks after the first dose and 16 weeks after the second dose.  Zoster vaccine. One dose is recommended for adults aged 53 years or older unless certain conditions are present.  Measles, mumps, and rubella (MMR) vaccine. Adults born before [birth year] generally are considered immune to measles and mumps. Adults born in [birth year] or later should have 1 or more doses of MMR vaccine unless there is a contraindication to the vaccine or there is laboratory evidence of immunity to each of the three diseases. A routine second dose of MMR  vaccine should be obtained at least 28 days after the first dose for students attending postsecondary schools, health care workers, or international travelers. People who received inactivated measles vaccine or an unknown type of measles vaccine during 1963-1967 should receive 2 doses of MMR vaccine. People who received inactivated mumps vaccine or an unknown type of mumps vaccine before 1979 and are at high risk for mumps infection should consider immunization with 2 doses of MMR vaccine. Unvaccinated health care workers born before [birth year] who lack laboratory evidence of measles, mumps, or rubella immunity or laboratory confirmation of disease should consider measles and mumps immunization with 2 doses of MMR vaccine or rubella immunization with 1 dose of MMR vaccine.  Pneumococcal 13-valent conjugate (PCV13) vaccine. When indicated, a person who is uncertain of his immunization history and has no record of immunization should receive the PCV13 vaccine. An adult aged 58 years or older who has certain medical conditions and has not been previously immunized should receive 1 dose of PCV13 vaccine. This PCV13 should be followed with a dose of pneumococcal polysaccharide (PPSV23) vaccine. The PPSV23 vaccine dose should be obtained at least 8 weeks after the dose of PCV13 vaccine. An adult  aged 36 years or older who has certain medical conditions and previously received 1 or more doses of PPSV23 vaccine should receive 1 dose of PCV13. The PCV13 vaccine dose should be obtained 1 or more years after the last PPSV23 vaccine dose.  Pneumococcal polysaccharide (PPSV23) vaccine. When PCV13 is also indicated, PCV13 should be obtained first. All adults aged 43 years and older should be immunized. An adult younger than age 60 years who has certain medical conditions should be immunized. Any person who resides in a nursing home or long-term care facility should be immunized. An adult smoker should be immunized. People with an immunocompromised condition and certain other conditions should receive both PCV13 and PPSV23 vaccines. People with human immunodeficiency virus (HIV) infection should be immunized as soon as possible after diagnosis. Immunization during chemotherapy or radiation therapy should be avoided. Routine use of PPSV23 vaccine is not recommended for American Indians, [place] Natives, or people younger than 65 years unless there are medical conditions that require PPSV23 vaccine. When indicated, people who have unknown immunization and have no record of immunization should receive PPSV23 vaccine. One-time revaccination 5 years after the first dose of PPSV23 is recommended for people aged 19-64 years who have chronic kidney failure, nephrotic syndrome, asplenia, or immunocompromised conditions. People who received 1-2 doses of PPSV23 before age 92 years should receive another dose of PPSV23 vaccine at age 5 years or later if at least 5 years have passed since the previous dose. Doses of PPSV23 are not needed for people immunized with PPSV23 at or after age 69 years.  Meningococcal vaccine. Adults with asplenia or persistent complement component deficiencies should receive 2 doses of quadrivalent meningococcal conjugate (MenACWY-D) vaccine. The doses should be obtained at least 2 months apart.  Microbiologists working with certain meningococcal bacteria, [place] recruits, people at risk during an outbreak, and people who travel to or live in countries with a high rate of meningitis should be immunized. A first-year college student up through age 7 years who is living in a residence hall should receive a dose if he did not receive a dose on or after his 16th birthday. Adults who have certain high-risk conditions should receive one or more doses of vaccine.  Hepatitis A vaccine. Adults who wish to be protected from this disease,  have certain high-risk conditions, work with hepatitis A-infected animals, work in hepatitis A research labs, or travel to or work in countries with a high rate of hepatitis A should be immunized. Adults who were previously unvaccinated and who anticipate close contact with an international adoptee during the first 60 days after arrival in the [place] from a country with a high rate of hepatitis A should be immunized.  Hepatitis B vaccine. Adults should be immunized if they wish to be protected from this disease, have certain high-risk conditions, may be exposed to blood or other infectious body fluids, are household contacts or sex partners of hepatitis B positive people, are clients or workers in certain care facilities, or travel to or work in countries with a high rate of hepatitis B.  Haemophilus influenzae type b (Hib) vaccine. A previously unvaccinated person with asplenia or sickle cell disease or having a scheduled splenectomy should receive 1 dose of Hib vaccine. Regardless of previous immunization, a recipient of a hematopoietic stem cell transplant should receive a 3-dose series 6-12 months after his successful transplant. Hib vaccine is not recommended for adults with HIV infection. Preventive Service / Frequency Ages 88 to 41  Blood pressure check.** / Every 1 to 2 years.  Lipid and cholesterol check.** / Every 5 years beginning at age  14.  Hepatitis C blood test.** / For any individual with known risks for hepatitis C.  Skin self-exam. / Monthly.  Influenza vaccine. / Every year.  Tetanus, diphtheria, and acellular pertussis (Tdap, Td) vaccine.** / Consult your health care provider. 1 dose of Td every 10 years.  Varicella vaccine.** / Consult your health care provider.  HPV vaccine. / 3 doses over 6 months, if 27 or younger.  Measles, mumps, rubella (MMR) vaccine.** / You need at least 1 dose of MMR if you were born in 1957 or later. You may also need a second dose.  Pneumococcal 13-valent conjugate (PCV13) vaccine.** / Consult your health care provider.  Pneumococcal polysaccharide (PPSV23) vaccine.** / 1 to 2 doses if you smoke cigarettes or if you have certain conditions.  Meningococcal vaccine.** / 1 dose if you are age 43 to 86 years and a Market researcher living in a residence hall, or have one of several medical conditions. You may also need additional booster doses.  Hepatitis A vaccine.** / Consult your health care provider.  Hepatitis B vaccine.** / Consult your health care provider.  Haemophilus influenzae type b (Hib) vaccine.** / Consult your health care provider. Ages 20 to 15  Blood pressure check.** / Every 1 to 2 years.  Lipid and cholesterol check.** / Every 5 years beginning at age 79.  Lung cancer screening. / Every year if you are aged 75-80 years and have a 30-pack-year history of smoking and currently smoke or have quit within the past 15 years. Yearly screening is stopped once you have quit smoking for at least 15 years or develop a health problem that would prevent you from having lung cancer treatment.  Fecal occult blood test (FOBT) of stool. / Every year beginning at age 59 and continuing until age 61. You may not have to do this test if you get a colonoscopy every 10 years.  Flexible sigmoidoscopy** or colonoscopy.** / Every 5 years for a flexible sigmoidoscopy or every  10 years for a colonoscopy beginning at age 20 and continuing until age 72.  Hepatitis C blood test.** / For all people born from [birth year] through 1964 and any individual  with known risks for hepatitis C.  Skin self-exam. / Monthly.  Influenza vaccine. / Every year.  Tetanus, diphtheria, and acellular pertussis (Tdap/Td) vaccine.** / Consult your health care provider. 1 dose of Td every 10 years.  Varicella vaccine.** / Consult your health care provider.  Zoster vaccine.** / 1 dose for adults aged 48 years or older.  Measles, mumps, rubella (MMR) vaccine.** / You need at least 1 dose of MMR if you were born in 1956 or later. You may also need a second dose.  Pneumococcal 13-valent conjugate (PCV13) vaccine.** / Consult your health care provider.  Pneumococcal polysaccharide (PPSV23) vaccine.** / 1 to 2 doses if you smoke cigarettes or if you have certain conditions.  Meningococcal vaccine.** / Consult your health care provider.  Hepatitis A vaccine.** / Consult your health care provider.  Hepatitis B vaccine.** / Consult your health care provider.  Haemophilus influenzae type b (Hib) vaccine.** / Consult your health care provider. Ages 73 and over  Blood pressure check.** / Every 1 to 2 years.  Lipid and cholesterol check.**/ Every 5 years beginning at age 38.  Lung cancer screening. / Every year if you are aged 61-80 years and have a 30-pack-year history of smoking and currently smoke or have quit within the past 15 years. Yearly screening is stopped once you have quit smoking for at least 15 years or develop a health problem that would prevent you from having lung cancer treatment.  Fecal occult blood test (FOBT) of stool. / Every year beginning at age 64 and continuing until age 78. You may not have to do this test if you get a colonoscopy every 10 years.  Flexible sigmoidoscopy** or colonoscopy.** / Every 5 years for a flexible sigmoidoscopy or every 10 years for a colonoscopy  beginning at age 45 and continuing until age 49.  Hepatitis C blood test.** / For all people born from [birth year] through 1964 and any individual with known risks for hepatitis C.  Abdominal aortic aneurysm (AAA) screening.** / A one-time screening for ages 29 to 51 years who are current or former smokers.  Skin self-exam. / Monthly.  Influenza vaccine. / Every year.  Tetanus, diphtheria, and acellular pertussis (Tdap/Td) vaccine.** / 1 dose of Td every 10 years.  Varicella vaccine.** / Consult your health care provider.  Zoster vaccine.** / 1 dose for adults aged 58 years or older.  Pneumococcal 13-valent conjugate (PCV13) vaccine.** / Consult your health care provider.  Pneumococcal polysaccharide (PPSV23) vaccine.** / 1 dose for all adults aged 45 years and older.  Meningococcal vaccine.** / Consult your health care provider.  Hepatitis A vaccine.** / Consult your health care provider.  Hepatitis B vaccine.** / Consult your health care provider.  Haemophilus influenzae type b (Hib) vaccine.** / Consult your health care provider. **Family history and personal history of risk and conditions may change your health care provider's recommendations. Document Released: 02/06/2002 Document Revised: 12/16/2013 Document Reviewed: 05/08/2011 [facility] Patient Information 2015 [place], [place]. This information is not intended to replace advice given to you by your health care provider. Make sure you discuss any questions you have with your health care provider.

## 2015-04-13 NOTE — Addendum Note (Signed)
Addended by: Marcelline MatesMARTIN, Ayanah Snader on: 04/13/2015 02:22 PM   Modules accepted: K[employer] BroodSmartSet

## 2015-04-14 LAB — LIPID PANEL
Cholesterol: 249 mg/dL — ABNORMAL HIGH (ref 0–200)
HDL: 28.7 mg/dL — ABNORMAL LOW (ref >39.00)
NONHDL: 220.3
Total CHOL/HDL Ratio: 9
Triglycerides: 290 mg/dL — ABNORMAL HIGH (ref 0.0–149.0)
VLDL: 58 mg/dL — ABNORMAL HIGH (ref 0.0–40.0)

## 2015-04-14 LAB — LDL CHOLESTEROL, DIRECT: LDL DIRECT: 158 mg/dL

## 2015-04-14 LAB — COMPREHENSIVE METABOLIC PANEL
ALK PHOS: 39 U/L (ref 39–117)
ALT: 26 U/L (ref 0–53)
AST: 19 U/L (ref 0–37)
Albumin: 4.2 g/dL (ref 3.5–5.2)
BILIRUBIN TOTAL: 0.4 mg/dL (ref 0.2–1.2)
BUN: 11 mg/dL (ref 6–23)
CO2: 29 meq/L (ref 19–32)
CREATININE: 1.28 mg/dL (ref 0.40–1.50)
Calcium: 10 mg/dL (ref 8.4–10.5)
Chloride: 105 mEq/L (ref 96–112)
GFR: 83.11 mL/min (ref >60.00)
GLUCOSE: 84 mg/dL (ref 70–99)
Potassium: 4.2 mEq/L (ref 3.5–5.1)
Sodium: 141 mEq/L (ref 135–145)
Total Protein: 7.3 g/dL (ref 6.0–8.3)

## 2015-04-14 LAB — URINALYSIS, ROUTINE W REFLEX MICROSCOPIC
BILIRUBIN URINE: NEGATIVE
HGB URINE DIPSTICK: NEGATIVE
KETONES UR: NEGATIVE
Leukocytes, UA: NEGATIVE
NITRITE: NEGATIVE
SPECIFIC GRAVITY, URINE: 1.02 (ref 1.000–1.030)
TOTAL PROTEIN, URINE-UPE24: NEGATIVE
URINE GLUCOSE: NEGATIVE
Urobilinogen, UA: 0.2 (ref 0.0–1.0)
pH: 7 (ref 5.0–8.0)

## 2015-04-14 LAB — CBC
HCT: 44.8 % (ref 39.0–52.0)
Hemoglobin: 15 g/dL (ref 13.0–17.0)
MCHC: 33.5 g/dL (ref 30.0–36.0)
MCV: 84.9 fl (ref 78.0–100.0)
PLATELETS: 283 10*3/uL (ref 150.0–400.0)
RBC: 5.27 Mil/uL (ref 4.22–5.81)
RDW: 14 % (ref 11.5–15.5)
WBC: 5.2 10*3/uL (ref 4.0–10.5)

## 2015-04-14 LAB — TSH: TSH: 2.45 u[IU]/mL (ref 0.35–4.50)

## 2015-04-27 ENCOUNTER — Telehealth: Payer: Self-pay | Admitting: Physician Assistant

## 2015-04-27 NOTE — Telephone Encounter (Signed)
Relation to pt: self  Call back number: [phone]   Reason for call:  Pt would like to know he's lab results 04/13/15.

## 2015-04-27 NOTE — Telephone Encounter (Signed)
Pt notified of lab results, pt would like to do a little more research on medication and diets. Will call back whether to proceed with recommended medication.

## 2016-04-17 ENCOUNTER — Encounter: Payer: Self-pay | Admitting: Physician Assistant

## 2016-04-17 ENCOUNTER — Ambulatory Visit (INDEPENDENT_AMBULATORY_CARE_PROVIDER_SITE_OTHER): Payer: 59 | Admitting: Physician Assistant

## 2016-04-17 VITALS — BP 110/78 | HR 78 | Temp 98.1°F | Ht 69.0 in | Wt 240.2 lb

## 2016-04-17 DIAGNOSIS — R7989 Other specified abnormal findings of blood chemistry: Secondary | ICD-10-CM

## 2016-04-17 DIAGNOSIS — IMO0002 Reserved for concepts with insufficient information to code with codable children: Secondary | ICD-10-CM

## 2016-04-17 DIAGNOSIS — Z0000 Encounter for general adult medical examination without abnormal findings: Secondary | ICD-10-CM | POA: Diagnosis not present

## 2016-04-17 DIAGNOSIS — E668 Other obesity: Secondary | ICD-10-CM

## 2016-04-17 LAB — URINALYSIS, ROUTINE W REFLEX MICROSCOPIC
BILIRUBIN URINE: NEGATIVE
KETONES UR: NEGATIVE
LEUKOCYTES UA: NEGATIVE
NITRITE: NEGATIVE
Specific Gravity, Urine: 1.025 (ref 1.000–1.030)
Total Protein, Urine: NEGATIVE
Urine Glucose: NEGATIVE
Urobilinogen, UA: 0.2 (ref 0.0–1.0)
pH: 5.5 (ref 5.0–8.0)

## 2016-04-17 LAB — LIPID PANEL
Cholesterol: 226 mg/dL — ABNORMAL HIGH (ref 0–200)
HDL: 25.7 mg/dL — AB (ref >39.00)
NONHDL: 200.43
TRIGLYCERIDES: 215 mg/dL — AB (ref 0.0–149.0)
Total CHOL/HDL Ratio: 9
VLDL: 43 mg/dL — ABNORMAL HIGH (ref 0.0–40.0)

## 2016-04-17 LAB — LDL CHOLESTEROL, DIRECT: LDL DIRECT: 161 mg/dL

## 2016-04-17 LAB — TSH: TSH: 3.25 u[IU]/mL (ref 0.35–4.50)

## 2016-04-17 LAB — COMPREHENSIVE METABOLIC PANEL WITH GFR
ALT: 24 U/L (ref 0–53)
AST: 18 U/L (ref 0–37)
Albumin: 4.1 g/dL (ref 3.5–5.2)
Alkaline Phosphatase: 36 U/L — ABNORMAL LOW (ref 39–117)
BUN: 13 mg/dL (ref 6–23)
CO2: 28 meq/L (ref 19–32)
Calcium: 9.7 mg/dL (ref 8.4–10.5)
Chloride: 106 meq/L (ref 96–112)
Creatinine, Ser: 1.22 mg/dL (ref 0.40–1.50)
GFR: 87.31 mL/min
Glucose, Bld: 128 mg/dL — ABNORMAL HIGH (ref 70–99)
Potassium: 3.9 meq/L (ref 3.5–5.1)
Sodium: 142 meq/L (ref 135–145)
Total Bilirubin: 0.4 mg/dL (ref 0.2–1.2)
Total Protein: 7.3 g/dL (ref 6.0–8.3)

## 2016-04-17 LAB — CBC
HEMATOCRIT: 42.4 % (ref 39.0–52.0)
HEMOGLOBIN: 13.9 g/dL (ref 13.0–17.0)
MCHC: 32.8 g/dL (ref 30.0–36.0)
MCV: 84.8 fl (ref 78.0–100.0)
PLATELETS: 286 10*3/uL (ref 150.0–400.0)
RBC: 5 Mil/uL (ref 4.22–5.81)
RDW: 14.1 % (ref 11.5–15.5)
WBC: 6.5 10*3/uL (ref 4.0–10.5)

## 2016-04-17 LAB — HEMOGLOBIN A1C: Hgb A1c MFr Bld: 6 % (ref 4.6–6.5)

## 2016-04-17 NOTE — Patient Instructions (Signed)
Please go to the lab for blood work.   Our office will call you with your results unless you have chosen to receive results via MyChart.  If your blood work is normal we will follow-up each year for physicals and as scheduled for chronic medical problems.  If anything is abnormal we will treat accordingly and get you in for a follow-up.  Make sure to keep it up with diet and exercise regimen. Your goal is 150 minutes a week of exercise..  Preventive Care for Adults, Male A healthy lifestyle and preventive care can promote health and wellness. Preventive health guidelines for men include the following key practices:  A routine yearly physical is a good way to check with your health care provider about your health and preventative screening. It is a chance to share any concerns and updates on your health and to receive a thorough exam.  Visit your dentist for a routine exam and preventative care every 6 months. Brush your teeth twice a day and floss once a day. Good oral hygiene prevents tooth decay and gum disease.  The frequency of eye exams is based on your age, health, family medical history, use of contact lenses, and other factors. Follow your health care provider's recommendations for frequency of eye exams.  Eat a healthy diet. Foods such as vegetables, fruits, whole grains, low-fat dairy products, and lean protein foods contain the nutrients you need without too many calories. Decrease your intake of foods high in solid fats, added sugars, and salt. Eat the right amount of calories for you.Get information about a proper diet from your health care provider, if necessary.  Regular physical exercise is one of the most important things you can do for your health. Most adults should get at least 150 minutes of moderate-intensity exercise (any activity that increases your heart rate and causes you to sweat) each week. In addition, most adults need muscle-strengthening exercises on 2 or more  days a week.  Maintain a healthy weight. The body mass index (BMI) is a screening tool to identify possible weight problems. It provides an estimate of body fat based on height and weight. Your health care provider can find your BMI and can help you achieve or maintain a healthy weight.For adults 20 years and older:  A BMI below 18.5 is considered underweight.  A BMI of 18.5 to 24.9 is normal.  A BMI of 25 to 29.9 is considered overweight.  A BMI of 30 and above is considered obese.  Maintain normal blood lipids and cholesterol levels by exercising and minimizing your intake of saturated fat. Eat a balanced diet with plenty of fruit and vegetables. Blood tests for lipids and cholesterol should begin at age 76 and be repeated every 5 years. If your lipid or cholesterol levels are high, you are over 50, or you are at high risk for heart disease, you may need your cholesterol levels checked more frequently.Ongoing high lipid and cholesterol levels should be treated with medicines if diet and exercise are not working.  If you smoke, find out from your health care provider how to quit. If you do not use tobacco, do not start.  Lung cancer screening is recommended for adults aged 11-80 years who are at high risk for developing lung cancer because of a history of smoking. A yearly low-dose CT scan of the lungs is recommended for people who have at least a 30-pack-year history of smoking and are a current smoker or have quit within the  past 15 years. A pack year of smoking is smoking an average of 1 pack of cigarettes a day for 1 year (for example: 1 pack a day for 30 years or 2 packs a day for 15 years). Yearly screening should continue until the smoker has stopped smoking for at least 15 years. Yearly screening should be stopped for people who develop a health problem that would prevent them from having lung cancer treatment.  If you choose to drink alcohol, do not have more than 2 drinks per day. One  drink is considered to be 12 ounces (355 mL) of beer, 5 ounces (148 mL) of wine, or 1.5 ounces (44 mL) of liquor.  Avoid use of street drugs. Do not share needles with anyone. Ask for help if you need support or instructions about stopping the use of drugs.  High blood pressure causes heart disease and increases the risk of stroke. Your blood pressure should be checked at least every 1-2 years. Ongoing high blood pressure should be treated with medicines, if weight loss and exercise are not effective.  If you are 39-61 years old, ask your health care provider if you should take aspirin to prevent heart disease.  Diabetes screening is done by taking a blood sample to check your blood glucose level after you have not eaten for a certain period of time (fasting). If you are not overweight and you do not have risk factors for diabetes, you should be screened once every 3 years starting at age 71. If you are overweight or obese and you are 40-47 years of age, you should be screened for diabetes every year as part of your cardiovascular risk assessment.  Colorectal cancer can be detected and often prevented. Most routine colorectal cancer screening begins at the age of 34 and continues through age 57. However, your health care provider may recommend screening at an earlier age if you have risk factors for colon cancer. On a yearly basis, your health care provider may provide home test kits to check for hidden blood in the stool. Use of a small camera at the end of a tube to directly examine the colon (sigmoidoscopy or colonoscopy) can detect the earliest forms of colorectal cancer. Talk to your health care provider about this at age 80, when routine screening begins. Direct exam of the colon should be repeated every 5-10 years through age 14, unless early forms of precancerous polyps or small growths are found.  People who are at an increased risk for hepatitis B should be screened for this virus. You are  considered at high risk for hepatitis B if:  You were born in a country where hepatitis B occurs often. Talk with your health care provider about which countries are considered high risk.  Your parents were born in a high-risk country and you have not received a shot to protect against hepatitis B (hepatitis B vaccine).  You have HIV or AIDS.  You use needles to inject street drugs.  You live with, or have sex with, someone who has hepatitis B.  You are a man who has sex with other men (MSM).  You get hemodialysis treatment.  You take certain medicines for conditions such as cancer, organ transplantation, and autoimmune conditions.  Hepatitis C blood testing is recommended for all people born from [birth year] through 1965 and any individual with known risks for hepatitis C.  Practice safe sex. Use condoms and avoid high-risk sexual practices to reduce the spread of sexually transmitted infections (  STIs). STIs include gonorrhea, chlamydia, syphilis, trichomonas, herpes, HPV, and human immunodeficiency virus (HIV). Herpes, HIV, and HPV are viral illnesses that have no cure. They can result in disability, cancer, and death.  If you are a man who has sex with other men, you should be screened at least once per year for:  HIV.  Urethral, rectal, and pharyngeal infection of gonorrhea, chlamydia, or both.  If you are at risk of being infected with HIV, it is recommended that you take a prescription medicine daily to prevent HIV infection. This is called preexposure prophylaxis (PrEP). You are considered at risk if:  You are a man who has sex with other men (MSM) and have other risk factors.  You are a heterosexual man, are sexually active, and are at increased risk for HIV infection.  You take drugs by injection.  You are sexually active with a partner who has HIV.  Talk with your health care provider about whether you are at high risk of being infected with HIV. If you choose to begin PrEP,  you should first be tested for HIV. You should then be tested every 3 months for as long as you are taking PrEP.  A one-time screening for abdominal aortic aneurysm (AAA) and surgical repair of large AAAs by ultrasound are recommended for men ages 70 to 85 years who are current or former smokers.  Healthy men should no longer receive prostate-specific antigen (PSA) blood tests as part of routine cancer screening. Talk with your health care provider about prostate cancer screening.  Testicular cancer screening is not recommended for adult males who have no symptoms. Screening includes self-exam, a health care provider exam, and other screening tests. Consult with your health care provider about any symptoms you have or any concerns you have about testicular cancer.  Use sunscreen. Apply sunscreen liberally and repeatedly throughout the day. You should seek shade when your shadow is shorter than you. Protect yourself by wearing long sleeves, pants, a wide-brimmed hat, and sunglasses year round, whenever you are outdoors.  Once a month, do a whole-body skin exam, using a mirror to look at the skin on your back. Tell your health care provider about new moles, moles that have irregular borders, moles that are larger than a pencil eraser, or moles that have changed in shape or color.  Stay current with required vaccines (immunizations).  Influenza vaccine. All adults should be immunized every year.  Tetanus, diphtheria, and acellular pertussis (Td, Tdap) vaccine. An adult who has not previously received Tdap or who does not know his vaccine status should receive 1 dose of Tdap. This initial dose should be followed by tetanus and diphtheria toxoids (Td) booster doses every 10 years. Adults with an unknown or incomplete history of completing a 3-dose immunization series with Td-containing vaccines should begin or complete a primary immunization series including a Tdap dose. Adults should receive a Td booster  every 10 years.  Varicella vaccine. An adult without evidence of immunity to varicella should receive 2 doses or a second dose if he has previously received 1 dose.  Human papillomavirus (HPV) vaccine. Males aged 11-21 years who have not received the vaccine previously should receive the 3-dose series. Males aged 22-26 years may be immunized. Immunization is recommended through the age of 72 years for any male who has sex with males and did not get any or all doses earlier. Immunization is recommended for any person with an immunocompromised condition through the age of 48 years if  he did not get any or all doses earlier. During the 3-dose series, the second dose should be obtained 4-8 weeks after the first dose. The third dose should be obtained 24 weeks after the first dose and 16 weeks after the second dose.  Zoster vaccine. One dose is recommended for adults aged 37 years or older unless certain conditions are present.  Measles, mumps, and rubella (MMR) vaccine. Adults born before [birth year] generally are considered immune to measles and mumps. Adults born in [birth year] or later should have 1 or more doses of MMR vaccine unless there is a contraindication to the vaccine or there is laboratory evidence of immunity to each of the three diseases. A routine second dose of MMR vaccine should be obtained at least 28 days after the first dose for students attending postsecondary schools, health care workers, or international travelers. People who received inactivated measles vaccine or an unknown type of measles vaccine during 1963-1967 should receive 2 doses of MMR vaccine. People who received inactivated mumps vaccine or an unknown type of mumps vaccine before 1979 and are at high risk for mumps infection should consider immunization with 2 doses of MMR vaccine. Unvaccinated health care workers born before [birth year] who lack laboratory evidence of measles, mumps, or rubella immunity or laboratory confirmation of disease  should consider measles and mumps immunization with 2 doses of MMR vaccine or rubella immunization with 1 dose of MMR vaccine.  Pneumococcal 13-valent conjugate (PCV13) vaccine. When indicated, a person who is uncertain of his immunization history and has no record of immunization should receive the PCV13 vaccine. All adults 14 years of age and older should receive this vaccine. An adult aged 68 years or older who has certain medical conditions and has not been previously immunized should receive 1 dose of PCV13 vaccine. This PCV13 should be followed with a dose of pneumococcal polysaccharide (PPSV23) vaccine. Adults who are at high risk for pneumococcal disease should obtain the PPSV23 vaccine at least 8 weeks after the dose of PCV13 vaccine. Adults older than 34 years of age who have normal immune system function should obtain the PPSV23 vaccine dose at least 1 year after the dose of PCV13 vaccine.  Pneumococcal polysaccharide (PPSV23) vaccine. When PCV13 is also indicated, PCV13 should be obtained first. All adults aged 30 years and older should be immunized. An adult younger than age 58 years who has certain medical conditions should be immunized. Any person who resides in a nursing home or long-term care facility should be immunized. An adult smoker should be immunized. People with an immunocompromised condition and certain other conditions should receive both PCV13 and PPSV23 vaccines. People with human immunodeficiency virus (HIV) infection should be immunized as soon as possible after diagnosis. Immunization during chemotherapy or radiation therapy should be avoided. Routine use of PPSV23 vaccine is not recommended for American Indians, [address], or people younger than 65 years unless there are medical conditions that require PPSV23 vaccine. When indicated, people who have unknown immunization and have no record of immunization should receive PPSV23 vaccine. One-time revaccination 5 years after the  first dose of PPSV23 is recommended for people aged 19-64 years who have chronic kidney failure, nephrotic syndrome, asplenia, or immunocompromised conditions. People who received 1-2 doses of PPSV23 before age 27 years should receive another dose of PPSV23 vaccine at age 21 years or later if at least 5 years have passed since the previous dose. Doses of PPSV23 are not needed for people immunized with PPSV23 at or  after age 57 years.  Meningococcal vaccine. Adults with asplenia or persistent complement component deficiencies should receive 2 doses of quadrivalent meningococcal conjugate (MenACWY-D) vaccine. The doses should be obtained at least 2 months apart. Microbiologists working with certain meningococcal bacteria, [place] recruits, people at risk during an outbreak, and people who travel to or live in countries with a high rate of meningitis should be immunized. A first-year college student up through age 44 years who is living in a residence hall should receive a dose if he did not receive a dose on or after his 16th birthday. Adults who have certain high-risk conditions should receive one or more doses of vaccine.  Hepatitis A vaccine. Adults who wish to be protected from this disease, have chronic liver disease, work with hepatitis A-infected animals, work in hepatitis A research labs, or travel to or work in countries with a high rate of hepatitis A should be immunized. Adults who were previously unvaccinated and who anticipate close contact with an international adoptee during the first 60 days after arrival in the [place] from a country with a high rate of hepatitis A should be immunized.  Hepatitis B vaccine. Adults should be immunized if they wish to be protected from this disease, are under age 10 years and have diabetes, have chronic liver disease, have had more than one sex partner in the past 6 months, may be exposed to blood or other infectious body fluids, are household contacts or  sex partners of hepatitis B positive people, are clients or workers in certain care facilities, or travel to or work in countries with a high rate of hepatitis B.  Haemophilus influenzae type b (Hib) vaccine. A previously unvaccinated person with asplenia or sickle cell disease or having a scheduled splenectomy should receive 1 dose of Hib vaccine. Regardless of previous immunization, a recipient of a hematopoietic stem cell transplant should receive a 3-dose series 6-12 months after his successful transplant. Hib vaccine is not recommended for adults with HIV infection. Preventive Service / Frequency Ages 44 to 53  Blood pressure check.** / Every 3-5 years.  Lipid and cholesterol check.** / Every 5 years beginning at age 54.  Hepatitis C blood test.** / For any individual with known risks for hepatitis C.  Skin self-exam. / Monthly.  Influenza vaccine. / Every year.  Tetanus, diphtheria, and acellular pertussis (Tdap, Td) vaccine.** / Consult your health care provider. 1 dose of Td every 10 years.  Varicella vaccine.** / Consult your health care provider.  HPV vaccine. / 3 doses over 6 months, if 19 or younger.  Measles, mumps, rubella (MMR) vaccine.** / You need at least 1 dose of MMR if you were born in 1957 or later. You may also need a second dose.  Pneumococcal 13-valent conjugate (PCV13) vaccine.** / Consult your health care provider.  Pneumococcal polysaccharide (PPSV23) vaccine.** / 1 to 2 doses if you smoke cigarettes or if you have certain conditions.  Meningococcal vaccine.** / 1 dose if you are age 40 to 7 years and a [employer] researcher living in a residence hall, or have one of several medical conditions. You may also need additional booster doses.  Hepatitis A vaccine.** / Consult your health care provider.  Hepatitis B vaccine.** / Consult your health care provider.  Haemophilus influenzae type b (Hib) vaccine.** / Consult your health care provider. Ages 27  to 57  Blood pressure check.** / Every year.  Lipid and cholesterol check.** / Every 5 years beginning at age  20.  Lung cancer screening. / Every year if you are aged 19-80 years and have a 30-pack-year history of smoking and currently smoke or have quit within the past 15 years. Yearly screening is stopped once you have quit smoking for at least 15 years or develop a health problem that would prevent you from having lung cancer treatment.  Fecal occult blood test (FOBT) of stool. / Every year beginning at age 45 and continuing until age 76. You may not have to do this test if you get a colonoscopy every 10 years.  Flexible sigmoidoscopy** or colonoscopy.** / Every 5 years for a flexible sigmoidoscopy or every 10 years for a colonoscopy beginning at age 56 and continuing until age 93.  Hepatitis C blood test.** / For all people born from [birth year] through 1965 and any individual with known risks for hepatitis C.  Skin self-exam. / Monthly.  Influenza vaccine. / Every year.  Tetanus, diphtheria, and acellular pertussis (Tdap/Td) vaccine.** / Consult your health care provider. 1 dose of Td every 10 years.  Varicella vaccine.** / Consult your health care provider.  Zoster vaccine.** / 1 dose for adults aged 17 years or older.  Measles, mumps, rubella (MMR) vaccine.** / You need at least 1 dose of MMR if you were born in 1956 or later. You may also need a second dose.  Pneumococcal 13-valent conjugate (PCV13) vaccine.** / Consult your health care provider.  Pneumococcal polysaccharide (PPSV23) vaccine.** / 1 to 2 doses if you smoke cigarettes or if you have certain conditions.  Meningococcal vaccine.** / Consult your health care provider.  Hepatitis A vaccine.** / Consult your health care provider.  Hepatitis B vaccine.** / Consult your health care provider.  Haemophilus influenzae type b (Hib) vaccine.** / Consult your health care provider. Ages 51 and over  Blood pressure check.** /  Every year.  Lipid and cholesterol check.**/ Every 5 years beginning at age 107.  Lung cancer screening. / Every year if you are aged 57-80 years and have a 30-pack-year history of smoking and currently smoke or have quit within the past 15 years. Yearly screening is stopped once you have quit smoking for at least 15 years or develop a health problem that would prevent you from having lung cancer treatment.  Fecal occult blood test (FOBT) of stool. / Every year beginning at age 14 and continuing until age 55. You may not have to do this test if you get a colonoscopy every 10 years.  Flexible sigmoidoscopy** or colonoscopy.** / Every 5 years for a flexible sigmoidoscopy or every 10 years for a colonoscopy beginning at age 28 and continuing until age 52.  Hepatitis C blood test.** / For all people born from [birth year] through 1964 and any individual with known risks for hepatitis C.  Abdominal aortic aneurysm (AAA) screening.** / A one-time screening for ages 30 to 77 years who are current or former smokers.  Skin self-exam. / Monthly.  Influenza vaccine. / Every year.  Tetanus, diphtheria, and acellular pertussis (Tdap/Td) vaccine.** / 1 dose of Td every 10 years.  Varicella vaccine.** / Consult your health care provider.  Zoster vaccine.** / 1 dose for adults aged 61 years or older.  Pneumococcal 13-valent conjugate (PCV13) vaccine.** / 1 dose for all adults aged 67 years and older.  Pneumococcal polysaccharide (PPSV23) vaccine.** / 1 dose for all adults aged 38 years and older.  Meningococcal vaccine.** / Consult your health care provider.  Hepatitis A vaccine.** / Consult your health care provider.  Hepatitis B vaccine.** / Consult your health care provider.  Haemophilus influenzae type b (Hib) vaccine.** / Consult your health care provider. **Family history and personal history of risk and conditions may change your health care provider's recommendations.   This information is not  intended to replace advice given to you by your health care provider. Make sure you discuss any questions you have with your health care provider.   Document Released: 02/06/2002 Document Revised: 01/01/2015 Document Reviewed: 05/08/2011 Elsevier Interactive Patient Education [employer].

## 2016-04-17 NOTE — Progress Notes (Signed)
Patient presents to clinic today for annual exam.  Patient is fasting for labs.  Acute Concerns: Denies acute concerns at today's visit. Body mass index is 35.46 kg/(m^2). In terms of exercise -- patient has been doing stretching and pilates along with roller skating. Endorses lean meats. Trying to avoid any fried foods. Increase fruits and vegetables.   Health Maintenance: Immunizations -- up-to-date  Past Medical History  Diagnosis Date  . Asthma     Childhood -- Resolved.  . Kidney stones 2012    Past Surgical History  Procedure Laterality Date  . No past surgeries      Current Outpatient Prescriptions on File Prior to Visit  Medication Sig Dispense Refill  . Melatonin 1 MG CAPS 1 tablet by mouth nightly  0   No current facility-administered medications on file prior to visit.    Allergies  Allergen Reactions  . Sulfa Drugs Cross Reactors Rash    Rash    Family History  Problem Relation Age of Onset  . Healthy Mother     Living  . Healthy Father     Living  . Diabetes Maternal Grandmother   . Alzheimer's disease Paternal Grandfather   . Stroke Paternal Grandmother   . Diabetes Maternal Uncle   . Healthy Brother     Social History   Social History  . Marital Status: Single    Spouse Name: N/A  . Number of Children: N/A  . Years of Education: N/A   Occupational History  . Not on file.   Social History Main Topics  . Smoking status: Never Smoker   . Smokeless tobacco: Never Used  . Alcohol Use: 0.0 oz/week    0 Standard drinks or equivalent per week     Comment: occassionally  . Drug Use: No  . Sexual Activity:    Partners: Female   Other Topics Concern  . Not on file   Social History Narrative   Review of Systems  Constitutional: Negative for fever and weight loss.  HENT: Negative for ear discharge, ear pain, hearing loss and tinnitus.   Eyes: Negative for blurred vision, double vision, photophobia and pain.  Respiratory: Negative for  cough and shortness of breath.   Cardiovascular: Negative for chest pain and palpitations.  Gastrointestinal: Negative for heartburn, nausea, vomiting, abdominal pain, diarrhea, constipation, blood in stool and melena.  Genitourinary: Negative for dysuria, urgency, frequency, hematuria and flank pain.  Musculoskeletal: Negative for falls.  Neurological: Negative for dizziness, loss of consciousness and headaches.  Endo/Heme/Allergies: Negative for environmental allergies.  Psychiatric/Behavioral: Negative for depression, suicidal ideas, hallucinations and substance abuse. The patient is not nervous/anxious and does not have insomnia.     BP 110/78 mmHg  Pulse 78  Temp(Src) 98.1 F (36.7 C) (Oral)  Ht 5\' 9"  (1.753 m)  Wt 240 lb 3.2 oz (108.954 kg)  BMI 35.46 kg/m2  SpO2 98%  Physical Exam  Constitutional: He is oriented to person, place, and time and well-developed, well-nourished, and in no distress.  HENT:  Head: Normocephalic and atraumatic.  Right Ear: External ear normal.  Left Ear: External ear normal.  Nose: Nose normal.  Mouth/Throat: Oropharynx is clear and moist. No oropharyngeal exudate.  Eyes: Conjunctivae and EOM are normal. Pupils are equal, round, and reactive to light.  Neck: Neck supple. No thyromegaly present.  Cardiovascular: Normal rate, regular rhythm, normal heart sounds and intact distal pulses.   Pulmonary/Chest: Effort normal and breath sounds normal. No respiratory distress. He has no  wheezes. He has no rales. He exhibits no tenderness.  Abdominal: Soft. Bowel sounds are normal. He exhibits no distension and no mass. There is no tenderness. There is no rebound and no guarding.  Genitourinary: Testes/scrotum normal.  Lymphadenopathy:    He has no cervical adenopathy.  Neurological: He is alert and oriented to person, place, and time.  Skin: Skin is warm and dry. No rash noted.  Psychiatric: Affect normal.  Vitals reviewed.  Assessment/Plan: Visit for  preventive health examination Depression screen negative. Health Maintenance reviewed -- up-to-date. Preventive schedule discussed and handout given in AVS. Will obtain fasting labs today.

## 2016-04-17 NOTE — Assessment & Plan Note (Signed)
Depression screen negative. Health Maintenance reviewed -- up-to-date. Preventive schedule discussed and handout given in AVS. Will obtain fasting labs today.  

## 2016-04-19 ENCOUNTER — Telehealth: Payer: Self-pay | Admitting: *Deleted

## 2016-04-19 DIAGNOSIS — R8290 Unspecified abnormal findings in urine: Secondary | ICD-10-CM

## 2016-04-19 DIAGNOSIS — R319 Hematuria, unspecified: Secondary | ICD-10-CM

## 2016-04-19 MED ORDER — ATORVASTATIN CALCIUM 20 MG PO TABS: 20.0000 mg | ORAL_TABLET | Freq: Every day | ORAL | Status: DC

## 2016-04-19 NOTE — Telephone Encounter (Signed)
Called and spoke with the pt and informed him of recent lab results and note.  Pt verbalized understanding and agreed.  New prescription sent to the pharmacy by e-script.  Future labs ordered and sent.  Pt was scheduled a 3 month follow-up on (Wed-07/19/16 @ 9:15am) and lab appt for UA w/mirco and UC on Wed-05/03/16 @ 9:15am).//AB/CMA

## 2016-04-19 NOTE — Telephone Encounter (Signed)
-----   Message from Waldon MerlWilliam C Martin, PA-C sent at 04/18/2016  8:42 AM EDT ----- Lab results are in:  (1) Blood count, Thyroid, Kidney function, Liver function and Electrolytes are normal. (2) A1C is at 6.0 -- in the pre-diabetes range. Need to increase exercise and limit meals high in carbohydrates. (3) Cholesterol has worsened with LDL in 160s and HDL (good) cholesterol low. This increases his risk for stroke and heart attack. Need to start medication. Please send in Rx Lipitor 20 mg to take daily. Follow-up 3 months. (4) Urine shows some small amount of blood. This can be from heavy exercise or even trauma from intercourse. Can be from more serious urinary issues but unlikely giving young age. We need to reassess. Want him to return to lab in 1-2 weeks for repeat UA with micro and culture.

## 2016-05-03 ENCOUNTER — Other Ambulatory Visit: Payer: 59

## 2016-05-26 ENCOUNTER — Other Ambulatory Visit: Payer: 59

## 2016-05-26 ENCOUNTER — Other Ambulatory Visit (INDEPENDENT_AMBULATORY_CARE_PROVIDER_SITE_OTHER): Payer: 59

## 2016-05-26 DIAGNOSIS — R319 Hematuria, unspecified: Secondary | ICD-10-CM

## 2016-05-26 DIAGNOSIS — R8290 Unspecified abnormal findings in urine: Secondary | ICD-10-CM

## 2016-05-26 LAB — URINALYSIS, ROUTINE W REFLEX MICROSCOPIC
BILIRUBIN URINE: NEGATIVE
KETONES UR: NEGATIVE
LEUKOCYTES UA: NEGATIVE
Nitrite: NEGATIVE
PH: 6 (ref 5.0–8.0)
SPECIFIC GRAVITY, URINE: 1.025 (ref 1.000–1.030)
Total Protein, Urine: NEGATIVE
URINE GLUCOSE: NEGATIVE
UROBILINOGEN UA: 0.2 (ref 0.0–1.0)
WBC, UA: NONE SEEN (ref 0–?)

## 2016-05-28 LAB — CULTURE, URINE COMPREHENSIVE

## 2016-06-07 ENCOUNTER — Telehealth: Payer: Self-pay | Admitting: *Deleted

## 2016-06-07 ENCOUNTER — Encounter: Payer: Self-pay | Admitting: *Deleted

## 2016-06-07 MED ORDER — AMOXICILLIN 500 MG PO CAPS: 500.0000 mg | ORAL_CAPSULE | Freq: Two times a day (BID) | ORAL | Status: DC

## 2016-06-07 NOTE — Telephone Encounter (Signed)
After making numerous attempts to reach patiet by phone unsuccessfully, Letter mailed with results and further provider instructions; Rx to pharmacy/SLS 06/14

## 2016-06-07 NOTE — Telephone Encounter (Signed)
-----   Message from Waldon MerlWilliam C Martin, PA-C sent at 05/29/2016  1:01 PM EDT ----- Urine culture shows bacteria. Giving he is a young male, prior UA findings and low risk for contaminated urine specimen, recommend we treat with Amoxicillin 500 mg BID x 7 days.

## 2016-06-16 ENCOUNTER — Telehealth: Payer: Self-pay | Admitting: Physician Assistant

## 2016-06-16 NOTE — Telephone Encounter (Signed)
Called and spoke with the pt and informed him of recent culture results and note.   Pt verbalized understanding.  Informed the pt that we had tried on several attempts to reach him by phone but unable to do so.   Pt stated that he received a call from the pharmacy telling him that he had a prescription there for the Amoxicillin.  Pt stated that he will go by the pharmacy and pick up the prescription and start it.//AB/CMA

## 2016-06-16 NOTE — Telephone Encounter (Signed)
°  Relationship to patient: Self Can be reached: [phone]    Reason for call: Please call patient to inform him why he was given amoxicillin.

## 2016-07-19 ENCOUNTER — Ambulatory Visit: Payer: 59 | Admitting: Physician Assistant

## 2016-07-21 ENCOUNTER — Encounter: Payer: Self-pay | Admitting: Physician Assistant

## 2016-07-21 ENCOUNTER — Ambulatory Visit (INDEPENDENT_AMBULATORY_CARE_PROVIDER_SITE_OTHER): Payer: 59 | Admitting: Physician Assistant

## 2016-07-21 ENCOUNTER — Ambulatory Visit (HOSPITAL_BASED_OUTPATIENT_CLINIC_OR_DEPARTMENT_OTHER)
Admission: RE | Admit: 2016-07-21 | Discharge: 2016-07-21 | Disposition: A | Discharge status: Home / Self Care | Payer: 59 | Source: Ambulatory Visit | Attending: Physician Assistant | Admitting: Physician Assistant

## 2016-07-21 VITALS — BP 121/81 | HR 65 | Temp 98.7°F | Resp 16 | Ht 69.0 in | Wt 244.5 lb

## 2016-07-21 DIAGNOSIS — R109 Unspecified abdominal pain: Secondary | ICD-10-CM | POA: Diagnosis present

## 2016-07-21 DIAGNOSIS — Z87442 Personal history of urinary calculi: Secondary | ICD-10-CM

## 2016-07-21 DIAGNOSIS — N1330 Unspecified hydronephrosis: Secondary | ICD-10-CM | POA: Diagnosis not present

## 2016-07-21 DIAGNOSIS — N201 Calculus of ureter: Secondary | ICD-10-CM | POA: Insufficient documentation

## 2016-07-21 DIAGNOSIS — N200 Calculus of kidney: Secondary | ICD-10-CM | POA: Insufficient documentation

## 2016-07-21 DIAGNOSIS — R10A0 Flank pain, unspecified side: Secondary | ICD-10-CM

## 2016-07-21 DIAGNOSIS — E785 Hyperlipidemia, unspecified: Secondary | ICD-10-CM | POA: Diagnosis not present

## 2016-07-21 DIAGNOSIS — K439 Ventral hernia without obstruction or gangrene: Secondary | ICD-10-CM | POA: Insufficient documentation

## 2016-07-21 LAB — COMPREHENSIVE METABOLIC PANEL
ALK PHOS: 38 U/L — AB (ref 39–117)
ALT: 28 U/L (ref 0–53)
AST: 16 U/L (ref 0–37)
Albumin: 4.3 g/dL (ref 3.5–5.2)
BILIRUBIN TOTAL: 0.3 mg/dL (ref 0.2–1.2)
BUN: 15 mg/dL (ref 6–23)
CALCIUM: 9.6 mg/dL (ref 8.4–10.5)
CO2: 31 meq/L (ref 19–32)
Chloride: 106 mEq/L (ref 96–112)
Creatinine, Ser: 1.47 mg/dL (ref 0.40–1.50)
GFR: 70.3 mL/min (ref >60.00)
Glucose, Bld: 100 mg/dL — ABNORMAL HIGH (ref 70–99)
POTASSIUM: 4.6 meq/L (ref 3.5–5.1)
Sodium: 139 mEq/L (ref 135–145)
Total Protein: 7.4 g/dL (ref 6.0–8.3)

## 2016-07-21 LAB — LIPID PANEL
CHOL/HDL RATIO: 4
Cholesterol: 127 mg/dL (ref 0–200)
HDL: 30.1 mg/dL — AB (ref >39.00)
LDL Cholesterol: 77 mg/dL (ref 0–99)
NONHDL: 96.47
TRIGLYCERIDES: 97 mg/dL (ref 0.0–149.0)
VLDL: 19.4 mg/dL (ref 0.0–40.0)

## 2016-07-21 MED ORDER — KETOROLAC TROMETHAMINE 60 MG/2ML IM SOLN
60.0000 mg | Freq: Once | INTRAMUSCULAR | Status: AC
  Administered 2016-07-21: 60 mg via INTRAMUSCULAR

## 2016-07-21 MED ORDER — HYDROCODONE-ACETAMINOPHEN 10-325 MG PO TABS: 1.0000 | ORAL_TABLET | Freq: Three times a day (TID) | ORAL | 0 refills | Status: DC | PRN

## 2016-07-21 NOTE — Progress Notes (Signed)
Patient presents to clinic today for follow-up of hyperlipidemia. Patient currently on Lipitor 20 mg daily. Is taking as directed. Denies side effect of medications. Is watching diet sometimes, but is not consistent. No regular exercise at present.  Patient with history of kidney stones, notes waxing/waning L flank pain since this morning. Averaging a 2/10 but is now at a 8/10. Denies trauma or injury. Denies abdominal pain, nausea or vomiting. Denies change to bowel or bladder habits. Has not taken anything for symptoms.   Past Medical History:  Diagnosis Date  . Asthma    Childhood -- Resolved.  . Kidney stones 2012    Current Outpatient Prescriptions on File Prior to Visit  Medication Sig Dispense Refill  . atorvastatin (LIPITOR) 20 MG tablet Take 1 tablet (20 mg total) by mouth daily. 90 tablet 0  . Melatonin 1 MG CAPS 1 tablet by mouth nightly  0   No current facility-administered medications on file prior to visit.     Allergies  Allergen Reactions  . Sulfa Drugs Cross Reactors Rash    Rash    Family History  Problem Relation Age of Onset  . Healthy Mother     Living  . Healthy Father     Living  . Diabetes Maternal Grandmother   . Alzheimer's disease Paternal Grandfather   . Stroke Paternal Grandmother   . Diabetes Maternal Uncle   . Healthy Brother     Social History   Social History  . Marital status: Single    Spouse name: N/A  . Number of children: N/A  . Years of education: N/A   Social History Main Topics  . Smoking status: Never Smoker  . Smokeless tobacco: Never Used  . Alcohol use 0.0 oz/week     Comment: occassionally  . Drug use: No  . Sexual activity: Yes    Partners: Female   Other Topics Concern  . None   Social History Narrative  . None    Review of Systems - See HPI.  All other ROS are negative.  BP 121/81 (BP Location: Right Arm, Patient Position: Sitting, Cuff Size: Large)   Pulse 65   Temp 98.7 F (37.1 C) (Oral)    Resp 16   Ht _0  (1.753 m)   Wt 244 lb 8 oz (110.9 kg)   SpO2 98%   BMI 36.11 kg/m   Physical Exam  Constitutional: He is oriented to person, place, and time and well-developed, well-nourished, and in no distress.  HENT:  Head: Normocephalic and atraumatic.  Eyes: Conjunctivae are normal.  Neck: Neck supple.  Cardiovascular: Normal rate, regular rhythm, normal heart sounds and intact distal pulses.   Pulmonary/Chest: Breath sounds normal. No respiratory distress. He has no wheezes. He has no rales. He exhibits no tenderness.  Abdominal: Soft. Bowel sounds are normal. He exhibits no distension and no mass. There is no tenderness. There is no rebound and no guarding.  Negative CVA tenderness  Neurological: He is alert and oriented to person, place, and time.  Skin: Skin is warm and dry. No rash noted.  Psychiatric: Affect normal.  Vitals reviewed.   Recent Results (from the past 2160 hour(s))  Urinalysis, Routine w reflex microscopic     Status: Abnormal   Collection Time: 05/26/16 11:58 AM  Result Value Ref Range   Color, Urine YELLOW Yellow;Lt. Yellow   APPearance CLEAR Clear   Specific Gravity, Urine 1.025 1.000 - 1.030   pH 6.0 5.0 - 8.0  Total Protein, Urine NEGATIVE Negative   Urine Glucose NEGATIVE Negative   Ketones, ur NEGATIVE Negative   Bilirubin Urine NEGATIVE Negative   Hgb urine dipstick TRACE-INTACT (A) Negative   Urobilinogen, UA 0.2 0.0 - 1.0   Leukocytes, UA NEGATIVE Negative   Nitrite NEGATIVE Negative   WBC, UA none seen 0-2/hpf   RBC / HPF 0-2/hpf 0-2/hpf   Mucus, UA Presence of (A) None  CULTURE, URINE COMPREHENSIVE     Status: None   Collection Time: 05/26/16 11:58 AM  Result Value Ref Range   Colony Count 9,000 COLONIES/ML    Organism ID, Bacteria GROUP B STREP (S.AGALACTIAE) ISOLATED     Comment: Beta hemolytic streptococci are predictably susceptible to penicillin and other beta-lactams. Susceptibility testing not routinely performed.      Assessment/Plan: 1. Flank pain Symptoms and history fit concern for an obstructing stone. Im Toradol given in office. Patient monitored for 10 minutes. Pain down to 3/10. STAT CT renal stone study obtained, revealing L 3 mm x 6 mm stone with mild hydronephrosis noted. No other acute findings. Results reviewed with patient. Strainer given. Rx norco. Hydrate well. Urology consulted -- appointment scheduled with [facility]. ER precautions discussed. - ketorolac (TORADOL) injection 60 mg; Inject 2 mLs (60 mg total) into the muscle once.  - CT RENAL STONE STUDY; Future  2. Hyperlipidemia Taking medication as directed. Will check labs today. Dietary and exercise recommendations discussed with patient.  - Lipid Profile - Comp Met (CMET)   Leeanne Rio, PA-C

## 2016-07-24 ENCOUNTER — Telehealth: Payer: Self-pay | Admitting: Physician Assistant

## 2016-07-24 NOTE — Telephone Encounter (Signed)
°  Relation to [employer] Call back [phone] Pharmacy:  Reason for call: pt was seen on Friday, and states he forgot to get a note to provide to his job, would like to come and pick up a note today if possible, pt would like for you to call him and let him know when he can get it today.

## 2016-07-24 NOTE — Telephone Encounter (Signed)
Spoke with patient to get dates needed for work note, letter completed and placed at front desk; Patient informed, understood & agreed/SLS 07/31

## 2016-07-27 ENCOUNTER — Encounter: Payer: Self-pay | Admitting: Physician Assistant

## 2016-07-28 ENCOUNTER — Emergency Department (HOSPITAL_COMMUNITY)
Admission: EM | Admit: 2016-07-28 | Discharge: 2016-07-29 | Disposition: A | Discharge status: Home / Self Care | Payer: 59 | Attending: Emergency Medicine | Admitting: Emergency Medicine

## 2016-07-28 ENCOUNTER — Encounter (HOSPITAL_COMMUNITY): Payer: Self-pay

## 2016-07-28 ENCOUNTER — Emergency Department (HOSPITAL_COMMUNITY): Payer: 59

## 2016-07-28 DIAGNOSIS — R109 Unspecified abdominal pain: Secondary | ICD-10-CM | POA: Diagnosis present

## 2016-07-28 DIAGNOSIS — J45909 Unspecified asthma, uncomplicated: Secondary | ICD-10-CM | POA: Diagnosis not present

## 2016-07-28 DIAGNOSIS — Z79899 Other long term (current) drug therapy: Secondary | ICD-10-CM | POA: Diagnosis not present

## 2016-07-28 DIAGNOSIS — N200 Calculus of kidney: Secondary | ICD-10-CM | POA: Diagnosis not present

## 2016-07-28 DIAGNOSIS — N179 Acute kidney failure, unspecified: Secondary | ICD-10-CM | POA: Insufficient documentation

## 2016-07-28 LAB — BASIC METABOLIC PANEL
ANION GAP: 9 (ref 5–15)
BUN: 19 mg/dL (ref 6–20)
CO2: 24 mmol/L (ref 22–32)
Calcium: 9.4 mg/dL (ref 8.9–10.3)
Chloride: 104 mmol/L (ref 101–111)
Creatinine, Ser: 2.19 mg/dL — ABNORMAL HIGH (ref 0.61–1.24)
GFR calc Af Amer: 43 mL/min — ABNORMAL LOW (ref >60)
GFR, EST NON AFRICAN AMERICAN: 37 mL/min — AB (ref >60)
GLUCOSE: 93 mg/dL (ref 65–99)
POTASSIUM: 5.4 mmol/L — AB (ref 3.5–5.1)
Sodium: 137 mmol/L (ref 135–145)

## 2016-07-28 LAB — CBC WITH DIFFERENTIAL/PLATELET
Basophils Absolute: 0 10*3/uL (ref 0.0–0.1)
Basophils Relative: 1 %
EOS PCT: 4 %
Eosinophils Absolute: 0.3 10*3/uL (ref 0.0–0.7)
HEMATOCRIT: 41.6 % (ref 39.0–52.0)
Hemoglobin: 14.1 g/dL (ref 13.0–17.0)
LYMPHS ABS: 2.3 10*3/uL (ref 0.7–4.0)
LYMPHS PCT: 28 %
MCH: 28.3 pg (ref 26.0–34.0)
MCHC: 33.9 g/dL (ref 30.0–36.0)
MCV: 83.4 fL (ref 78.0–100.0)
MONO ABS: 1 10*3/uL (ref 0.1–1.0)
MONOS PCT: 12 %
NEUTROS ABS: 4.6 10*3/uL (ref 1.7–7.7)
Neutrophils Relative %: 55 %
PLATELETS: 233 10*3/uL (ref 150–400)
RBC: 4.99 MIL/uL (ref 4.22–5.81)
RDW: 13.3 % (ref 11.5–15.5)
WBC: 8.1 10*3/uL (ref 4.0–10.5)

## 2016-07-28 LAB — URINALYSIS, ROUTINE W REFLEX MICROSCOPIC
Bilirubin Urine: NEGATIVE
Glucose, UA: NEGATIVE mg/dL
KETONES UR: NEGATIVE mg/dL
LEUKOCYTES UA: NEGATIVE
NITRITE: NEGATIVE
PH: 5.5 (ref 5.0–8.0)
Protein, ur: NEGATIVE mg/dL
Specific Gravity, Urine: 1.018 (ref 1.005–1.030)

## 2016-07-28 LAB — URINE MICROSCOPIC-ADD ON
Squamous Epithelial / LPF: NONE SEEN
WBC, UA: NONE SEEN WBC/hpf (ref 0–5)

## 2016-07-28 MED ORDER — SODIUM CHLORIDE 0.9 % IV BOLUS (SEPSIS)
1000.0000 mL | Freq: Once | INTRAVENOUS | Status: AC
  Administered 2016-07-29: 1000 mL via INTRAVENOUS

## 2016-07-28 NOTE — ED Triage Notes (Addendum)
Patient advised last week at urologist that has a kidney stone on left side.  Patient was advised that would pass on its own and given hydrocodone for pain.   Patient states that pain is becoming worse today.  Patient last took advil at 1800 today.  Patient denies difficulty urinating or blood in urine, rates pain 5/10.

## 2016-07-28 NOTE — ED Provider Notes (Signed)
[facility]-EMERGENCY DEPT Provider Note   CSN: [id number] Arrival date & time: 07/28/16  1191  First Provider Contact:  None       History   Chief Complaint Chief Complaint  Patient presents with  . Flank Pain    HPI Tyler Espinoza is a 35 y.o. male.  HPI   Left flank pain started Friday, saw doctor, pain was 3-4/10 then went to an 8 at the doctors office.  They gave him toradol shot, went to see Urologist the same day.  Said take 2 aleve to help the pain.   Reports it helped with the pain, however Wed-Thurs was getting worse, taking hydrocodone which he was not taking much of before given severe pain, and ibuprofen and flomax at the same time, and every time layed in certain position, it flared up.  Has swollen feeling on left side, pain level right now a 2/10, at home it is about 7/10.  Ibuprofen is helping reduce the pain.  Pain worse when resting, better when moving around.   No fevers, chills, n/v.  Eating and drinking ok.  Urinating ok.  Past Medical History:  Diagnosis Date  . Asthma    Childhood -- Resolved.  . Kidney stones 2012  . Kidney stones     Patient Active Problem List   Diagnosis Date Noted  . Hyperlipidemia 07/21/2016  . Visit for preventive health examination 04/13/2015  . Sleeping difficulty 02/24/2015    Past Surgical History:  Procedure Laterality Date  . NO PAST SURGERIES         Home Medications    Prior to Admission medications   Medication Sig Start Date End Date Taking? Authorizing Provider  atorvastatin (LIPITOR) 20 MG tablet Take 20 mg by mouth at bedtime.   Yes Historical Provider, MD  tamsulosin (FLOMAX) 0.4 MG CAPS capsule Take 0.4 mg by mouth daily after supper.   Yes Historical Provider, MD  HYDROcodone-acetaminophen (NORCO/VICODIN) 5-325 MG tablet Take 1-2 tablets by mouth every 4 (four) hours as needed for severe pain. 07/29/16   Alvira Monday, MD  ondansetron (ZOFRAN ODT) 4 MG disintegrating tablet Take 1 tablet (4 mg total) by  mouth every 8 (eight) hours as needed for nausea or vomiting. 07/29/16   Alvira Monday, MD    Family History Family History  Problem Relation Age of Onset  . Healthy Mother     Living  . Healthy Father     Living  . Diabetes Maternal Grandmother   . Alzheimer's disease Paternal Grandfather   . Stroke Paternal Grandmother   . Diabetes Maternal Uncle   . Healthy Brother     Social History Social History  Substance Use Topics  . Smoking status: Never Smoker  . Smokeless tobacco: Never Used  . Alcohol use 0.0 oz/week     Comment: socially     Allergies   Sulfa antibiotics   Review of Systems Review of Systems  Constitutional: Negative for fever.  HENT: Negative for sore throat.   Eyes: Negative for visual disturbance.  Respiratory: Negative for shortness of breath.   Cardiovascular: Negative for chest pain.  Gastrointestinal: Positive for abdominal pain. Negative for nausea and vomiting.  Genitourinary: Positive for flank pain. Negative for difficulty urinating, dysuria and urgency.  Musculoskeletal: Negative for back pain and neck stiffness.  Skin: Negative for rash.  Neurological: Negative for syncope and headaches.     Physical Exam Updated Vital Signs BP 104/67 (BP Location: Right Arm)   Pulse 85   Temp  98.4 F (36.9 C) (Oral)   Resp 18   Ht 5\' 8"  (1.727 m)   Wt 244 lb (110.7 kg)   SpO2 100%   BMI 37.10 kg/m   Physical Exam  Constitutional: He is oriented to person, place, and time. He appears well-developed and well-nourished. No distress.  HENT:  Head: Normocephalic and atraumatic.  Eyes: Conjunctivae and EOM are normal.  Neck: Normal range of motion.  Cardiovascular: Normal rate, regular rhythm, normal heart sounds and intact distal pulses.  Exam reveals no gallop and no friction rub.   No murmur heard. Pulmonary/Chest: Effort normal and breath sounds normal. No respiratory distress. He has no wheezes. He has no rales.  Abdominal: Soft. He  exhibits no distension. There is tenderness (mild left sided). There is no guarding and no CVA tenderness.  Musculoskeletal: He exhibits no edema.  Neurological: He is alert and oriented to person, place, and time.  Skin: Skin is warm and dry. He is not diaphoretic.  Nursing note and vitals reviewed.    ED Treatments / Results  Labs (all labs ordered are listed, but only abnormal results are displayed) Labs Reviewed  URINALYSIS, ROUTINE W REFLEX MICROSCOPIC (NOT AT [facility]) - Abnormal; Notable for the following:       Result Value   Hgb urine dipstick TRACE (*)    All other components within normal limits  BASIC METABOLIC PANEL - Abnormal; Notable for the following:    Potassium 5.4 (*)    Creatinine, Ser 2.19 (*)    GFR calc non Af Amer 37 (*)    GFR calc Af Amer 43 (*)    All other components within normal limits  URINE MICROSCOPIC-ADD ON - Abnormal; Notable for the following:    Bacteria, UA RARE (*)    All other components within normal limits  I-STAT CHEM 8, ED - Abnormal; Notable for the following:    Potassium 3.3 (*)    Chloride 112 (*)    Creatinine, Ser 1.50 (*)    Calcium, Ion 0.91 (*)    Hemoglobin 9.9 (*)    HCT 29.0 (*)    All other components within normal limits  I-STAT CHEM 8, ED - Abnormal; Notable for the following:    Creatinine, Ser 1.90 (*)    Calcium, Ion 1.04 (*)    Hemoglobin 12.6 (*)    HCT 37.0 (*)    All other components within normal limits  CBC WITH DIFFERENTIAL/PLATELET    EKG  EKG Interpretation  Date/Time:  Saturday July 29 2016 00:06:17 EDT Ventricular Rate:  60 PR Interval:    QRS Duration: 99 QT Interval:  411 QTC Calculation: 411 R Axis:   59 Text Interpretation:  Sinus rhythm Low voltage, precordial leads ST elev, probable normal early repol pattern No previous ECGs available Confirmed by [facility] MD, Zariyah Stephens ([id number]) on 07/29/2016 1:23:40 AM       Radiology Ct Renal Stone Study  Result Date: 07/28/2016 CLINICAL DATA:   35 year old male with left flank pain EXAM: CT ABDOMEN AND PELVIS WITHOUT CONTRAST TECHNIQUE: Multidetector CT imaging of the abdomen and pelvis was performed following the standard protocol without IV contrast. COMPARISON:  Abdominal radiograph dated 07/21/2016 and ET dated 07/21/2016 FINDINGS: Evaluation of this exam is limited in the absence of intravenous contrast. Minimal bibasilar dependent atelectatic changes of the lungs. The visualized lung bases are otherwise clear. No intra-abdominal free air or free fluid. Mild diffuse fatty infiltration of the liver. Subcentimeter left hepatic hypodense lesion is not  characterized but likely represents a cyst or hemangioma. The gallbladder, pancreas, spleen, adrenal glands appear unremarkable. There is a 5 mm stone along the posterior wall of the urinary bladder adjacent to the left UVJ which may represent a recently passed left renal calculus versus a left UVJ stone. There is mild left hydronephrosis. There is a 5 mm nonobstructing right renal upper pole calculus. Multiple smaller and nonobstructing right renal stones noted. Ill-defined 1 cm right renal interpolar hypodense lesion is not well characterized but may represent a cyst. There is no hydronephrosis on the right. The right ureter appears unremarkable. The prostate and seminal vesicles are grossly unremarkable. Constipation. There is no evidence of bowel obstruction or active inflammation. Normal appendix. The abdominal aorta and IVC appear unremarkable on this noncontrast study. No portal venous gas identified. There is no adenopathy. Is a small fat containing umbilical hernia. The abdominal wall soft tissues are otherwise unremarkable. The osseous structures are intact. IMPRESSION: A 5 mm left UVJ stone with mild left hydronephrosis. Electronically Signed   By: Elgie Collard M.D.   On: 07/28/2016 23:12    Procedures Procedures (including critical care time)  Medications Ordered in ED Medications    HYDROcodone-acetaminophen (NORCO/VICODIN) 5-325 MG per tablet 1 tablet (not administered)  sodium chloride 0.9 % bolus 1,000 mL (0 mLs Intravenous Stopped 07/29/16 0128)  sodium chloride 0.9 % bolus 1,000 mL (1,000 mLs Intravenous New Bag/Given 07/29/16 0129)     Initial Impression / Assessment and Plan / ED Course  I have reviewed the triage vital signs and the nursing notes.  Pertinent labs & imaging results that were available during my care of the patient were reviewed by me and considered in my medical decision making (see chart for details).  Clinical Course   68 old male with a history of nephrolithiasis, presents with concern for left abdominal and flank pain. Patient was diagnosed with a kidney stone on Friday by his PCP and sent to urology, and has been taking ibuprofen, Flomax, and Norco for pain with continuing symptoms so we presented to the emergency department. CT was done and showed a 5 mm obstructing UVJ stone. There are no signs of infection. Patient has an acute kidney injury with a creatinine increased from 1.47 to 2.19. Patient with mild hyperkalemia 5.4. There are no hyperkalemic changes on EKG. Patient was given IV fluids, with repeat i-STAT chem 8 showing a potassium of 3.3.  Of note, suspect dilution of sample given hgb decrease to 9.9 from 14, and pt without hx to suggest bleeding.   Repeat I-STAT Chem 8 again obtained given concern for initial sample dilution and shows improvement of potassium to 3.9, creatinine of 1.9, hgb12.6.  Discussed AKI and duration of symptoms with Urology on call, and discussed options with patient, including continued home pain control and outpatient urology follow-up, as well as possibility of urologic procedure. Patient feels his pain is tolerable and prefers continued medical management at this time. Discussed avoidance of NSAIDs in setting of acute kidney injury. Given patient unable to take ibuprofen, gave additional prescription for Norco and  zofran for nausea. Patient discharged in stable condition with understanding of reasons to return.   Final Clinical Impressions(s) / ED Diagnoses   Final diagnoses:  Left flank pain  Kidney stone  Acute kidney injury (HCC)    New Prescriptions New Prescriptions   HYDROCODONE-ACETAMINOPHEN (NORCO/VICODIN) 5-325 MG TABLET    Take 1-2 tablets by mouth every 4 (four) hours as needed for severe pain.  ONDANSETRON (ZOFRAN ODT) 4 MG DISINTEGRATING TABLET    Take 1 tablet (4 mg total) by mouth every 8 (eight) hours as needed for nausea or vomiting.     Alvira Monday, MD 07/29/16 [phone]

## 2016-07-29 LAB — I-STAT CHEM 8, ED
BUN: 14 mg/dL (ref 6–20)
BUN: 17 mg/dL (ref 6–20)
CHLORIDE: 112 mmol/L — AB (ref 101–111)
CHLORIDE: 107 mmol/L (ref 101–111)
Calcium, Ion: 0.91 mmol/L — ABNORMAL LOW (ref 1.13–1.30)
Calcium, Ion: 1.04 mmol/L — ABNORMAL LOW (ref 1.13–1.30)
Creatinine, Ser: 1.5 mg/dL — ABNORMAL HIGH (ref 0.61–1.24)
Creatinine, Ser: 1.9 mg/dL — ABNORMAL HIGH (ref 0.61–1.24)
Glucose, Bld: 68 mg/dL (ref 65–99)
Glucose, Bld: 82 mg/dL (ref 65–99)
HCT: 29 % — ABNORMAL LOW (ref 39.0–52.0)
HEMATOCRIT: 37 % — AB (ref 39.0–52.0)
Hemoglobin: 9.9 g/dL — ABNORMAL LOW (ref 13.0–17.0)
Hemoglobin: 12.6 g/dL — ABNORMAL LOW (ref 13.0–17.0)
POTASSIUM: 3.3 mmol/L — AB (ref 3.5–5.1)
POTASSIUM: 3.9 mmol/L (ref 3.5–5.1)
SODIUM: 144 mmol/L (ref 135–145)
SODIUM: 141 mmol/L (ref 135–145)
TCO2: 20 mmol/L (ref 0–100)
TCO2: 22 mmol/L (ref 0–100)

## 2016-07-29 MED ORDER — HYDROCODONE-ACETAMINOPHEN 10-325 MG PO TABS: 1.0000 | ORAL_TABLET | Freq: Three times a day (TID) | ORAL | 0 refills | Status: DC | PRN

## 2016-07-29 MED ORDER — SODIUM CHLORIDE 0.9 % IV BOLUS (SEPSIS)
1000.0000 mL | Freq: Once | INTRAVENOUS | Status: AC
  Administered 2016-07-29: 1000 mL via INTRAVENOUS

## 2016-07-29 MED ORDER — HYDROCODONE-ACETAMINOPHEN 5-325 MG PO TABS: 1.0000 | ORAL_TABLET | ORAL | 0 refills | Status: DC | PRN

## 2016-07-29 MED ORDER — ONDANSETRON 4 MG PO TBDP: 4.0000 mg | ORAL_TABLET | Freq: Three times a day (TID) | ORAL | 0 refills | Status: DC | PRN

## 2016-07-29 MED ORDER — HYDROCODONE-ACETAMINOPHEN 5-325 MG PO TABS
1.0000 | ORAL_TABLET | Freq: Once | ORAL | Status: AC
  Administered 2016-07-29: 1 via ORAL
  Filled 2016-07-29: qty 1

## 2016-09-12 ENCOUNTER — Encounter: Payer: Self-pay | Admitting: Physician Assistant

## 2016-09-12 MED ORDER — ATORVASTATIN CALCIUM 20 MG PO TABS: 20.0000 mg | ORAL_TABLET | Freq: Every day | ORAL | 1 refills | Status: DC

## 2016-09-26 ENCOUNTER — Encounter: Payer: Self-pay | Admitting: Physician Assistant

## 2017-01-16 ENCOUNTER — Encounter: Payer: Self-pay | Admitting: Physician Assistant

## 2017-01-16 ENCOUNTER — Ambulatory Visit (INDEPENDENT_AMBULATORY_CARE_PROVIDER_SITE_OTHER): Payer: 59 | Admitting: Physician Assistant

## 2017-01-16 VITALS — BP 122/80 | HR 78 | Temp 98.9°F | Resp 16 | Ht 69.0 in | Wt 247.0 lb

## 2017-01-16 DIAGNOSIS — M25531 Pain in right wrist: Secondary | ICD-10-CM | POA: Diagnosis not present

## 2017-01-16 MED ORDER — MELOXICAM 15 MG PO TABS: 15.0000 mg | ORAL_TABLET | Freq: Every day | ORAL | 0 refills | Status: DC

## 2017-01-16 NOTE — Patient Instructions (Signed)
Please wear brace at night and when possible in the day for the next 1-2 weeks. Avoid heavy lifting. Avoid typing when possible.  Apply topical Icy Hot or Aspercreme to the area.   Take Mobic as directed with food once daily. Tylenol if needed for breakthrough pain.   Follow-up if not resolving.

## 2017-01-16 NOTE — Progress Notes (Signed)
    Patient presents to clinic today c/o R wrist pain starting on Sunday. Is aching in nature and worse with ROM. Denies numbness or tingling of wrist, palm or fingers. Denies swelling. Denies known trauma or injury. Does type a lot at work. Also notes symptoms starting after playing several hours of video games.   Past Medical History:  Diagnosis Date  . Asthma    Childhood -- Resolved.  . Kidney stones 2012  . Kidney stones     Current Outpatient Prescriptions on File Prior to Visit  Medication Sig Dispense Refill  . atorvastatin (LIPITOR) 20 MG tablet Take 1 tablet (20 mg total) by mouth at bedtime. (Patient not taking: Reported on 01/16/2017) 90 tablet 1   No current facility-administered medications on file prior to visit.     Allergies  Allergen Reactions  . Sulfa Antibiotics Rash    Family History  Problem Relation Age of Onset  . Healthy Mother     Living  . Healthy Father     Living  . Diabetes Maternal Grandmother   . Alzheimer's disease Paternal Grandfather   . Stroke Paternal Grandmother   . Diabetes Maternal Uncle   . Healthy Brother     Social History   Social History  . Marital status: Single    Spouse name: N/A  . Number of children: N/A  . Years of education: N/A   Social History Main Topics  . Smoking status: Never Smoker  . Smokeless tobacco: Never Used  . Alcohol use 0.0 oz/week     Comment: socially  . Drug use: No  . Sexual activity: Yes    Partners: Female   Other Topics Concern  . None   Social History Narrative  . None    Review of Systems - See HPI.  All other ROS are negative.  BP 122/80   Pulse 78   Temp 98.9 F (37.2 C) (Oral)   Resp 16   Ht 5\' 9"  (1.753 m)   Wt 247 lb (112 kg)   SpO2 94%   BMI 36.48 kg/m   Physical Exam  Constitutional: He is well-developed, well-nourished, and in no distress.  HENT:  Head: Normocephalic and atraumatic.  Cardiovascular: Normal rate, regular rhythm, normal heart sounds and  intact distal pulses.   Musculoskeletal:       Right wrist: He exhibits tenderness. He exhibits normal range of motion, no bony tenderness, no swelling and no deformity.       Right hand: Normal. Normal sensation noted. Normal strength noted.  Pain reproduced with dorsiflexion on wrist.  Negative Tinel and Phalen signs.  Vitals reviewed.   No results found for this or any previous visit (from the past 2160 hour(s)).  Assessment/Plan: 1. Right wrist pain Strain. Start wearing Brace. Elevate while resting. Topical Icy Hot. Rx Mobic. Avoid trigger activities.  FU if not resolving.  - meloxicam (MOBIC) 15 MG tablet; Take 1 tablet (15 mg total) by mouth daily.  Dispense: 15 tablet; Refill: 0   Piedad ClimesMartin, Merlene Dante [name], [place] JerseyPA-C

## 2017-01-16 NOTE — Progress Notes (Signed)
Pre visit review using our clinic review tool, if applicable. No additional management support is needed unless otherwise documented below in the visit note. 

## 2017-02-21 DIAGNOSIS — R1084 Generalized abdominal pain: Secondary | ICD-10-CM | POA: Diagnosis not present

## 2017-02-21 DIAGNOSIS — N201 Calculus of ureter: Secondary | ICD-10-CM | POA: Diagnosis not present

## 2017-02-21 DIAGNOSIS — R112 Nausea with vomiting, unspecified: Secondary | ICD-10-CM | POA: Diagnosis not present

## 2017-02-27 ENCOUNTER — Emergency Department (HOSPITAL_COMMUNITY)
Admission: EM | Admit: 2017-02-27 | Discharge: 2017-02-28 | Disposition: A | Discharge status: Home / Self Care | Payer: 59 | Attending: Emergency Medicine | Admitting: Emergency Medicine

## 2017-02-27 ENCOUNTER — Encounter (HOSPITAL_COMMUNITY): Payer: Self-pay | Admitting: Emergency Medicine

## 2017-02-27 DIAGNOSIS — R109 Unspecified abdominal pain: Secondary | ICD-10-CM | POA: Insufficient documentation

## 2017-02-27 HISTORY — DX: Hyperlipidemia, unspecified: E78.5

## 2017-02-27 LAB — URINALYSIS, ROUTINE W REFLEX MICROSCOPIC
BACTERIA UA: NONE SEEN
BILIRUBIN URINE: NEGATIVE
Glucose, UA: NEGATIVE mg/dL
KETONES UR: NEGATIVE mg/dL
Nitrite: NEGATIVE
PROTEIN: NEGATIVE mg/dL
Specific Gravity, Urine: 1.006 (ref 1.005–1.030)
pH: 5 (ref 5.0–8.0)

## 2017-02-27 LAB — I-STAT CHEM 8, ED
BUN: 23 mg/dL — ABNORMAL HIGH (ref 6–20)
Calcium, Ion: 1.25 mmol/L (ref 1.15–1.40)
Chloride: 101 mmol/L (ref 101–111)
Creatinine, Ser: 2.2 mg/dL — ABNORMAL HIGH (ref 0.61–1.24)
Glucose, Bld: 89 mg/dL (ref 65–99)
HEMATOCRIT: 49 % (ref 39.0–52.0)
HEMOGLOBIN: 16.7 g/dL (ref 13.0–17.0)
POTASSIUM: 4.3 mmol/L (ref 3.5–5.1)
SODIUM: 140 mmol/L (ref 135–145)
TCO2: 28 mmol/L (ref 0–100)

## 2017-02-27 MED ORDER — KETOROLAC TROMETHAMINE 60 MG/2ML IM SOLN
30.0000 mg | Freq: Once | INTRAMUSCULAR | Status: AC
  Administered 2017-02-27: 30 mg via INTRAMUSCULAR
  Filled 2017-02-27: qty 2

## 2017-02-27 MED ORDER — SODIUM CHLORIDE 0.9 % IV BOLUS (SEPSIS)
1000.0000 mL | Freq: Once | INTRAVENOUS | Status: AC
  Administered 2017-02-27: 1000 mL via INTRAVENOUS

## 2017-02-27 NOTE — ED Notes (Signed)
Pt states that he has a kidney stone as per the urologist in which he saw today with 7/10 pain. Pt denies N/V/D and hematuria. Pt report that he has had kidney stones.

## 2017-02-27 NOTE — ED Provider Notes (Signed)
[facility]-EMERGENCY DEPT Provider Note   CSN: [id number] Arrival date & time: 02/27/17  2104  By signing my name below, I, Tyler CobbMaurice Espinoza, attest that this documentation has been prepared under the direction and in the presence of Fayrene HelperBowie Shavona Gunderman, PA-C. Electronically Signed: Orpah CobbMaurice Espinoza , ED Scribe. 02/27/17. 10:38 PM.   History   Chief Complaint Chief Complaint  Patient presents with  . Flank Pain    HPI  Tyler Espinoza is a 36 y.o. male with hx of kidney stones who presents to the Emergency Department complaining of intermittent, moderate R flank pain with sudden onset x2 days. Of note, pt developed R flank pain x6 days ago and was seen by his urologist. There were X-rays and ultrasounds done which revealed pt had a kidney stone that was out of the kidney but he has been unable to pass it. Pt states that this episode of R flank pain onset x1 day ago and has been intermittent. Pt rates the pain at 7/10 and describes the pain as pulsating. He reports R flank pain, R lower back pain. He reportedly took Hydrocodone with mild relief. Pt denies dysuria, fever, chills, vomiting, diarrhea, penile pain, testicular pain. Of note, pt states that his last kidney stones was x1 year ago and he was able to pass on his own. He denies hx of stent placement or lithotripsy.    The history is provided by the patient. No language interpreter was used.    Past Medical History:  Diagnosis Date  . Asthma    Childhood -- Resolved.  . Hyperlipemia   . Kidney stones 2012  . Kidney stones     Patient Active Problem List   Diagnosis Date Noted  . Hyperlipidemia 07/21/2016  . Visit for preventive health examination 04/13/2015  . Sleeping difficulty 02/24/2015    Past Surgical History:  Procedure Laterality Date  . NO PAST SURGERIES         Home Medications    Prior to Admission medications   Medication Sig Start Date End Date Taking? Authorizing Provider  HYDROcodone-acetaminophen (NORCO/VICODIN)  5-325 MG tablet Take 1 tablet by mouth every 6 (six) hours as needed for moderate pain.  02/21/17  Yes Historical Provider, MD  ondansetron (ZOFRAN) 8 MG tablet Take 1 tablet by mouth as needed for nausea or vomiting.  02/21/17  Yes Historical Provider, MD  tamsulosin (FLOMAX) 0.4 MG CAPS capsule Take 1 capsule by mouth daily. 02/21/17  Yes Historical Provider, MD  atorvastatin (LIPITOR) 20 MG tablet Take 1 tablet (20 mg total) by mouth at bedtime. Patient not taking: Reported on 01/16/2017 09/12/16   Waldon MerlWilliam C Martin, PA-C  meloxicam (MOBIC) 15 MG tablet Take 1 tablet (15 mg total) by mouth daily. Patient not taking: Reported on 02/27/2017 01/16/17   Waldon MerlWilliam C Martin, PA-C    Family History Family History  Problem Relation Age of Onset  . Healthy Mother     Living  . Healthy Father     Living  . Diabetes Maternal Grandmother   . Alzheimer's disease Paternal Grandfather   . Stroke Paternal Grandmother   . Diabetes Maternal Uncle   . Healthy Brother     Social History Social History  Substance Use Topics  . Smoking status: Never Smoker  . Smokeless tobacco: Never Used  . Alcohol use 0.0 oz/week     Comment: socially     Allergies   Sulfa antibiotics   Review of Systems Review of Systems  Constitutional: Negative for chills and fever.  Gastrointestinal: Negative for diarrhea and vomiting.  Genitourinary: Negative for dysuria, penile pain and testicular pain.     Physical Exam Updated Vital Signs BP 137/95 (BP Location: Left Arm)   Pulse 86   Temp 98.6 F (37 C) (Oral)   Resp 18   SpO2 97%   Physical Exam  Constitutional: He appears well-developed and well-nourished.  HENT:  Head: Normocephalic and atraumatic.  Eyes: Conjunctivae are normal.  Neck: Neck supple.  Cardiovascular: Normal rate, regular rhythm and normal heart sounds.   No murmur heard. Pulmonary/Chest: Effort normal and breath sounds normal. No respiratory distress.  Abdominal: Soft. There is no  tenderness. There is no CVA tenderness.  Musculoskeletal: He exhibits no edema.  Neurological: He is alert.  Skin: Skin is warm and dry.  Psychiatric: He has a normal mood and affect.  Nursing note and vitals reviewed.    ED Treatments / Results   DIAGNOSTIC STUDIES: Oxygen Saturation is 97% on RA, adequate by my interpretation.   COORDINATION OF CARE: 10:38 PM-Discussed next steps with pt. Pt verbalized understanding and is agreeable with the plan.    Labs (all labs ordered are listed, but only abnormal results are displayed) Labs Reviewed  URINALYSIS, ROUTINE W REFLEX MICROSCOPIC - Abnormal; Notable for the following:       Result Value   Color, Urine STRAW (*)    Hgb urine dipstick LARGE (*)    Leukocytes, UA SMALL (*)    Squamous Epithelial / LPF 0-5 (*)    All other components within normal limits  I-STAT CHEM 8, ED - Abnormal; Notable for the following:    BUN 23 (*)    Creatinine, Ser 2.20 (*)    All other components within normal limits    EKG  EKG Interpretation None       Radiology No results found.  Procedures Procedures (including critical care time)  Medications Ordered in ED Medications  ketorolac (TORADOL) injection 30 mg (30 mg Intramuscular Given 02/27/17 2306)  sodium chloride 0.9 % bolus 1,000 mL (1,000 mLs Intravenous New Bag/Given 02/27/17 2340)     Initial Impression / Assessment and Plan / ED Course  I have reviewed the triage vital signs and the nursing notes.  Pertinent labs & imaging results that were available during my care of the patient were reviewed by me and considered in my medical decision making (see chart for details).     BP 126/88 (BP Location: Right Arm)   Pulse 83   Temp 98.9 F (37.2 C) (Oral)   Resp 18   SpO2 97%    Final Clinical Impressions(s) / ED Diagnoses   Final diagnoses:  Right flank pain    New Prescriptions New Prescriptions   IBUPROFEN (ADVIL,MOTRIN) 600 MG TABLET    Take 1 tablet (600 mg  total) by mouth every 6 (six) hours as needed for moderate pain.   I personally performed the services described in this documentation, which was scribed in my presence. The recorded information has been reviewed and is accurate.     10:52 PM Pt report having R flank pain and was diagnosed with an obstructed kidney stone several days prior.  Still having the same pain.  No associated fever, abd pain, dysuria.  He is currently resting comfortably.  He does not have any CVA tenderness on exam.  UA with moderate Hgb but no evidence of UTI.  Will check renal function.  We did discussed option of CT scan to assess his kidney stone  status but I also discuss the risk/benefit of repeat CT including risk of increase radiation exposure.   11:26PM- Discussed with pt that his kidney functioning shows signs of kidney injury. Pt will be administered one unit of IV fluid. Pt agrees with plan.    1:09 AM Pt received IVF, felt fine, pain is well controlled.  He will f/u closely with [facility] for further management of his R flank pain.  Return precaution discussed.  Please note pt did receive toradol during this visit.   Fayrene Helper, PA-C 02/28/17 [id number]    Alvira Monday, MD 02/28/17 1410

## 2017-02-27 NOTE — ED Triage Notes (Signed)
Pt reports Hx kidney stones [facility] and is currently having right flank pain and frequent urination

## 2017-02-28 MED ORDER — IBUPROFEN 600 MG PO TABS: 600.0000 mg | ORAL_TABLET | Freq: Four times a day (QID) | ORAL | 0 refills | Status: DC | PRN

## 2017-02-28 NOTE — Discharge Instructions (Signed)
Your pain is likely due to an obstructed kidney stone. Continue with Flomax, alternate between tylenol and ibuprofen for pain.  Follow up with [facility] for further care.

## 2017-03-07 DIAGNOSIS — N201 Calculus of ureter: Secondary | ICD-10-CM | POA: Diagnosis not present

## 2017-03-07 DIAGNOSIS — R350 Frequency of micturition: Secondary | ICD-10-CM | POA: Diagnosis not present

## 2017-04-11 IMAGING — CT CT RENAL STONE PROTOCOL
2 of 3 series · 16 of 46 positions shown, 18 images · non-contrast
Comparison: Abdominal radiograph dated 07/21/2016 and ET dated
07/21/2016

CLINICAL DATA: 35-year-old male with left flank pain

EXAM:
CT ABDOMEN AND PELVIS WITHOUT CONTRAST
TECHNIQUE: Multidetector CT imaging of the abdomen and pelvis was performed
following the standard protocol without IV contrast.

[Series 4: lung · axial · 0.77mm/px · z∈[+1830,+1894]mm · 13 of 38 slices shown, 15 images]
[im 3/38  soft-tissue]
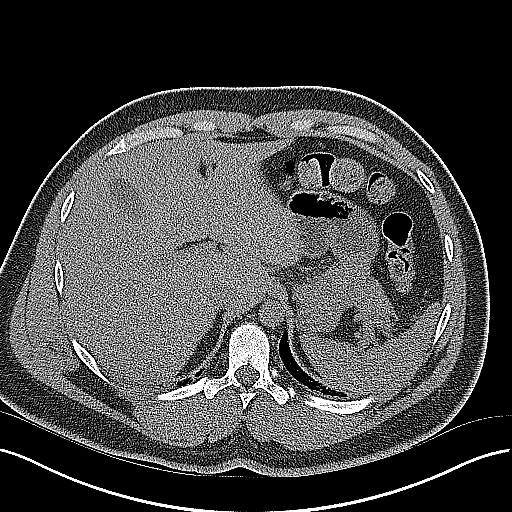
[im 3/38  bone]
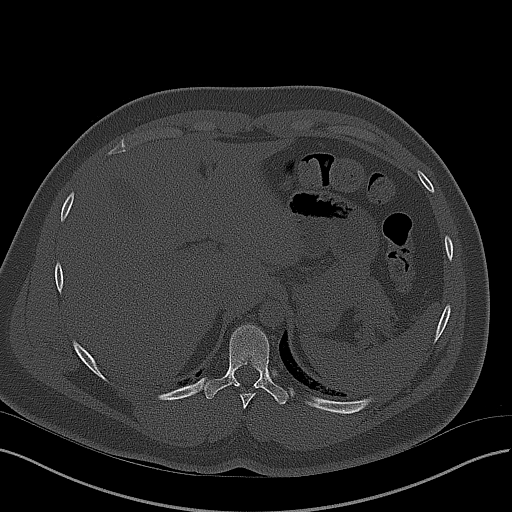
[im 5/38  soft-tissue]
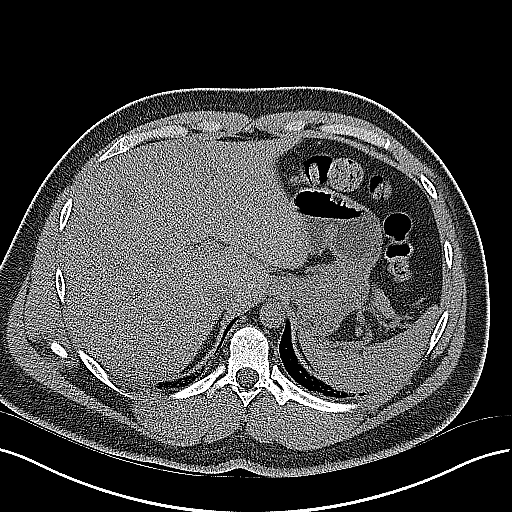
[im 8/38  soft-tissue]
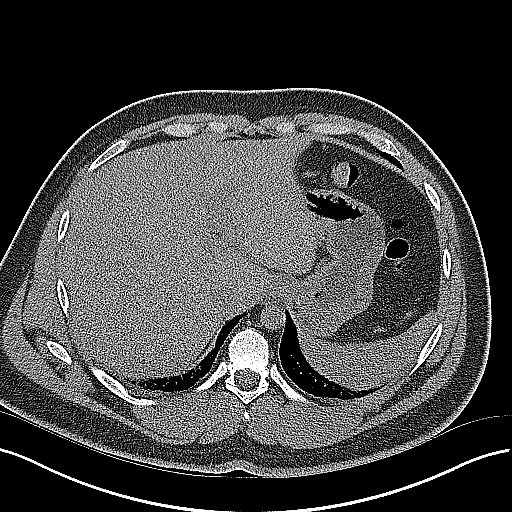
[im 11/38  soft-tissue]
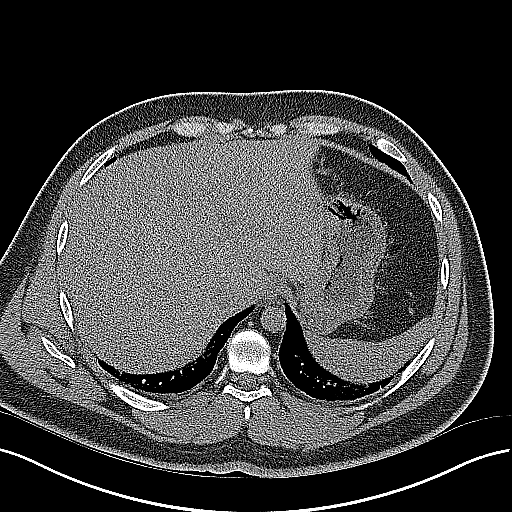
[im 14/38  soft-tissue]
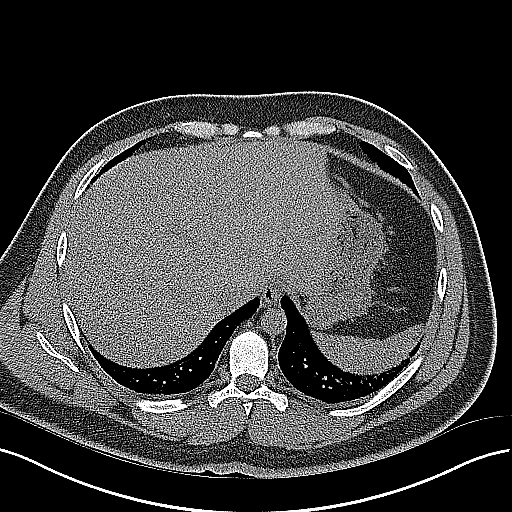
[im 16/38  soft-tissue]
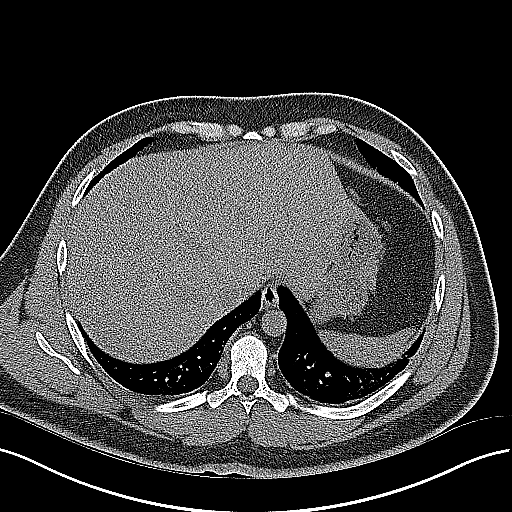
[im 20/38  soft-tissue]
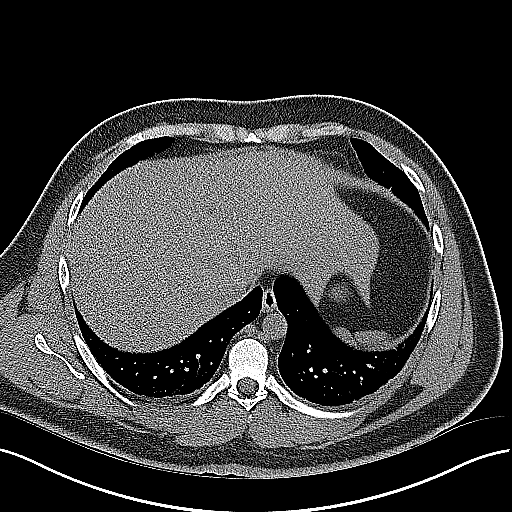
[im 22/38  soft-tissue]
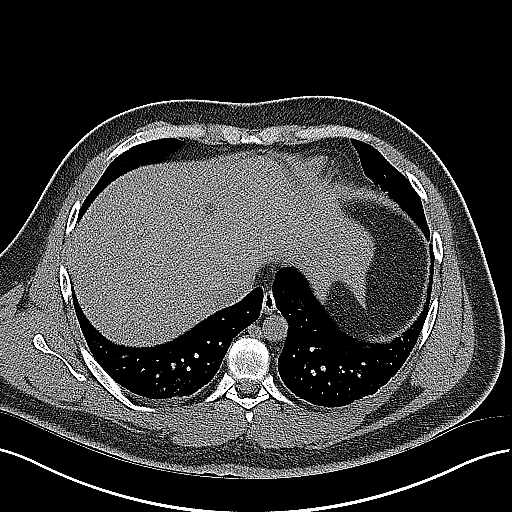
[im 24/38  soft-tissue]
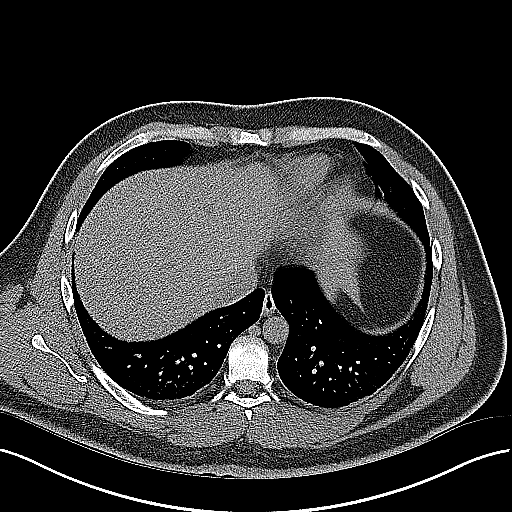
[im 24/38  bone]
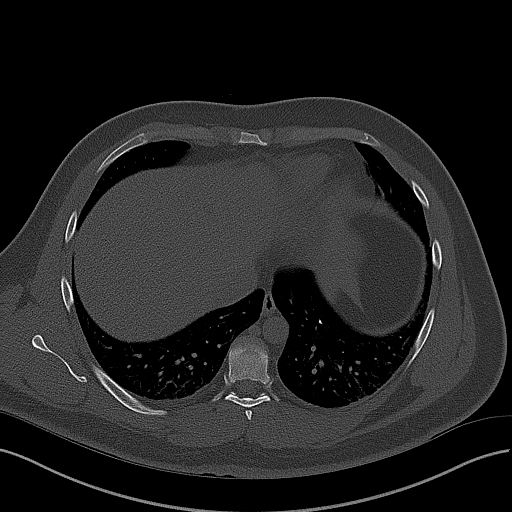
[im 27/38  soft-tissue]
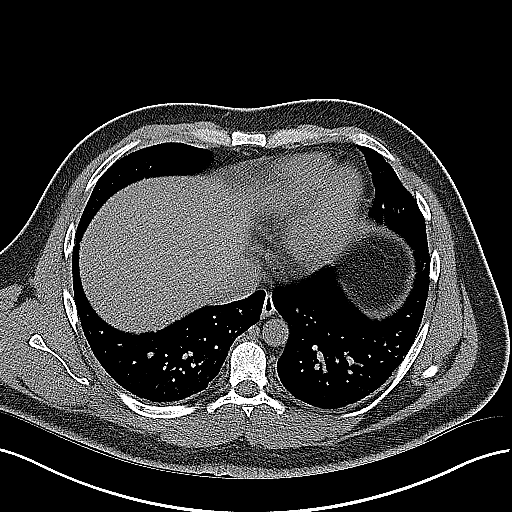
[im 30/38  soft-tissue]
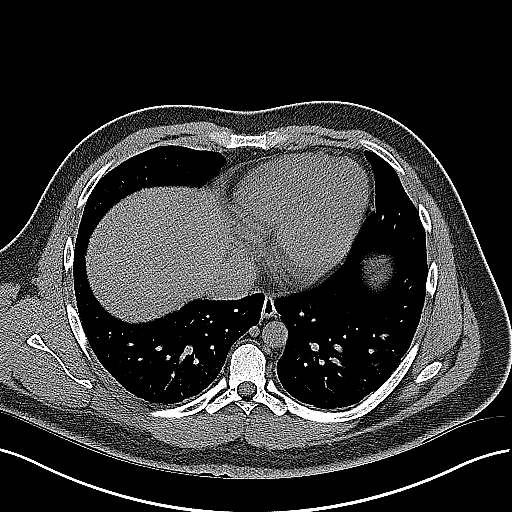
[im 33/38  soft-tissue]
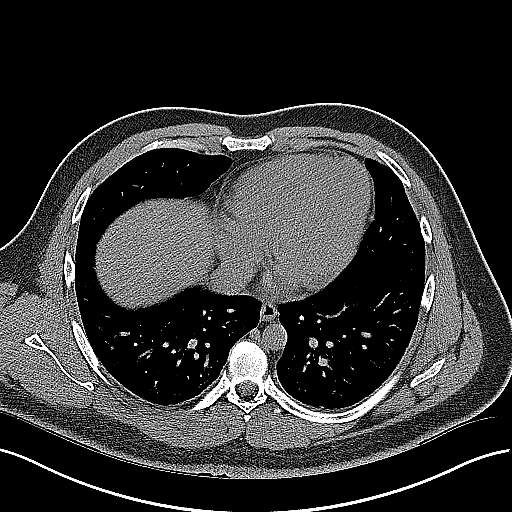
[im 35/38  soft-tissue]
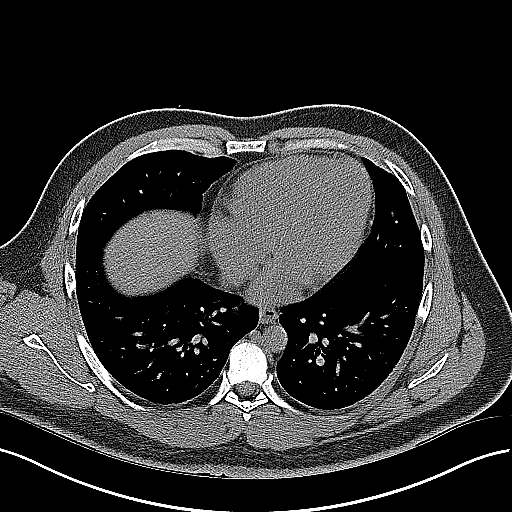

[Series 5: coronal · coronal · 0.74mm/px · 3 of 149 slices shown]
[im 50/149  soft-tissue]
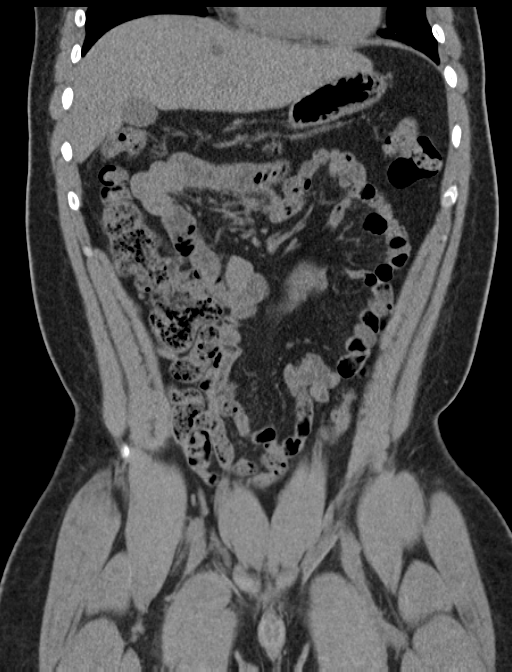
[im 66/149  soft-tissue]
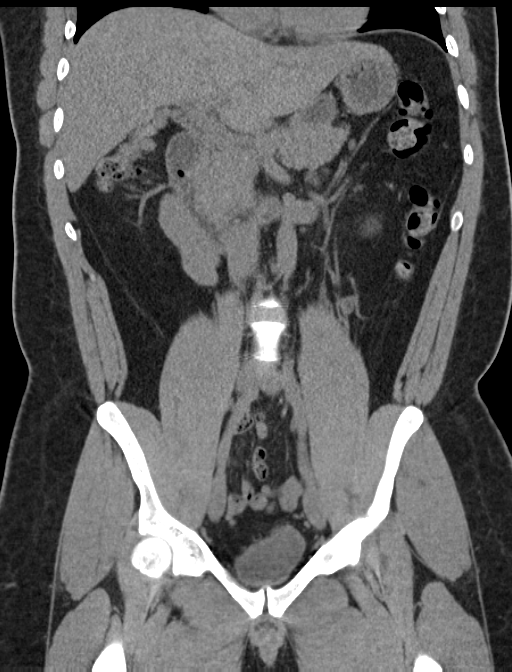
[im 83/149  soft-tissue]
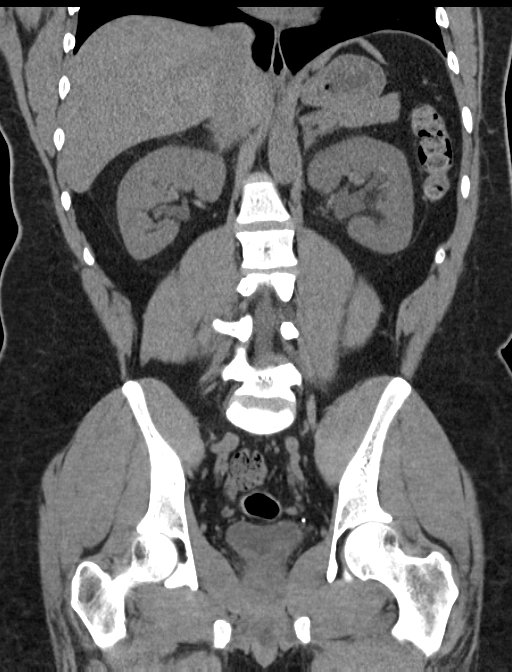

[16 of 46 positions shown; findings below may reference images not displayed]

FINDINGS: Evaluation of this exam is limited in the absence of intravenous
contrast.

Minimal bibasilar dependent atelectatic changes of the lungs. The
visualized lung bases are otherwise clear. No intra-abdominal free
air or free fluid.

Mild diffuse fatty infiltration of the liver. Subcentimeter left
hepatic hypodense lesion is not characterized but likely represents
a cyst or hemangioma. The gallbladder, pancreas, spleen, adrenal
glands appear unremarkable. There is a 5 mm stone along the
posterior wall of the urinary bladder adjacent to the left UVJ which
may represent a recently passed left renal calculus versus a left
UVJ stone. There is mild left hydronephrosis. There is a 5 mm
nonobstructing right renal upper pole calculus. Multiple smaller and
nonobstructing right renal stones noted. Ill-defined 1 cm right
renal interpolar hypodense lesion is not well characterized but may
represent a cyst. There is no hydronephrosis on the right. The right
ureter appears unremarkable. The prostate and seminal vesicles are
grossly unremarkable.

Constipation. There is no evidence of bowel obstruction or active
inflammation. Normal appendix.

The abdominal aorta and IVC appear unremarkable on this noncontrast
study. No portal venous gas identified. There is no adenopathy. Is a
small fat containing umbilical hernia. The abdominal wall soft
tissues are otherwise unremarkable. The osseous structures are
intact.
IMPRESSION: A 5 mm left UVJ stone with mild left hydronephrosis.

## 2017-04-18 ENCOUNTER — Encounter: Payer: 59 | Admitting: Physician Assistant

## 2017-04-20 ENCOUNTER — Encounter: Payer: 59 | Admitting: Physician Assistant

## 2017-04-24 ENCOUNTER — Ambulatory Visit (INDEPENDENT_AMBULATORY_CARE_PROVIDER_SITE_OTHER): Payer: 59 | Admitting: Physician Assistant

## 2017-04-24 ENCOUNTER — Encounter: Payer: Self-pay | Admitting: Physician Assistant

## 2017-04-24 VITALS — BP 104/80 | HR 70 | Temp 98.8°F | Resp 14 | Ht 69.0 in | Wt 244.0 lb

## 2017-04-24 DIAGNOSIS — Z0000 Encounter for general adult medical examination without abnormal findings: Secondary | ICD-10-CM

## 2017-04-24 DIAGNOSIS — E785 Hyperlipidemia, unspecified: Secondary | ICD-10-CM | POA: Diagnosis not present

## 2017-04-24 LAB — COMPREHENSIVE METABOLIC PANEL
ALK PHOS: 33 U/L — AB (ref 39–117)
ALT: 33 U/L (ref 0–53)
AST: 17 U/L (ref 0–37)
Albumin: 4 g/dL (ref 3.5–5.2)
BUN: 16 mg/dL (ref 6–23)
CALCIUM: 9.4 mg/dL (ref 8.4–10.5)
CO2: 30 mEq/L (ref 19–32)
Chloride: 106 mEq/L (ref 96–112)
Creatinine, Ser: 1.14 mg/dL (ref 0.40–1.50)
GFR: 93.86 mL/min (ref >60.00)
GLUCOSE: 96 mg/dL (ref 70–99)
POTASSIUM: 4.6 meq/L (ref 3.5–5.1)
SODIUM: 141 meq/L (ref 135–145)
TOTAL PROTEIN: 6.8 g/dL (ref 6.0–8.3)
Total Bilirubin: 0.4 mg/dL (ref 0.2–1.2)

## 2017-04-24 LAB — URINALYSIS, ROUTINE W REFLEX MICROSCOPIC
BILIRUBIN URINE: NEGATIVE
Hgb urine dipstick: NEGATIVE
KETONES UR: NEGATIVE
LEUKOCYTES UA: NEGATIVE
NITRITE: NEGATIVE
RBC / HPF: NONE SEEN (ref 0–?)
Specific Gravity, Urine: 1.025 (ref 1.000–1.030)
Total Protein, Urine: NEGATIVE
URINE GLUCOSE: NEGATIVE
UROBILINOGEN UA: 0.2 (ref 0.0–1.0)
pH: 6 (ref 5.0–8.0)

## 2017-04-24 LAB — LIPID PANEL
CHOL/HDL RATIO: 7
CHOLESTEROL: 234 mg/dL — AB (ref 0–200)
HDL: 33.6 mg/dL — AB (ref >39.00)
LDL Cholesterol: 166 mg/dL — ABNORMAL HIGH (ref 0–99)
NonHDL: 200.05
TRIGLYCERIDES: 170 mg/dL — AB (ref 0.0–149.0)
VLDL: 34 mg/dL (ref 0.0–40.0)

## 2017-04-24 LAB — HEMOGLOBIN A1C: HEMOGLOBIN A1C: 6 % (ref 4.6–6.5)

## 2017-04-24 LAB — CBC
HCT: 42.2 % (ref 39.0–52.0)
Hemoglobin: 13.8 g/dL (ref 13.0–17.0)
MCHC: 32.8 g/dL (ref 30.0–36.0)
MCV: 86.4 fl (ref 78.0–100.0)
PLATELETS: 285 10*3/uL (ref 150.0–400.0)
RBC: 4.88 Mil/uL (ref 4.22–5.81)
RDW: 14.5 % (ref 11.5–15.5)
WBC: 4.3 10*3/uL (ref 4.0–10.5)

## 2017-04-24 LAB — TSH: TSH: 1.97 u[IU]/mL (ref 0.35–4.50)

## 2017-04-24 NOTE — Progress Notes (Signed)
Pre visit review using our clinic review tool, if applicable. No additional management support is needed unless otherwise documented below in the visit note. 

## 2017-04-24 NOTE — Patient Instructions (Signed)
Please go to the lab for blood work.   Our office will call you with your results unless you have chosen to receive results via MyChart.  If your blood work is normal we will follow-up each year for physicals and as scheduled for chronic medical problems.  If anything is abnormal we will treat accordingly and get you in for a follow-up.  We will get records from Dr. Ralene Muskrat office for review.   Preventive Care 18-39 Years, Male Preventive care refers to lifestyle choices and visits with your health care provider that can promote health and wellness. What does preventive care include?  A yearly physical exam. This is also called an annual well check.  Dental exams once or twice a year.  Routine eye exams. Ask your health care provider how often you should have your eyes checked.  Personal lifestyle choices, including:  Daily care of your teeth and gums.  Regular physical activity.  Eating a healthy diet.  Avoiding tobacco and drug use.  Limiting alcohol use.  Practicing safe sex. What happens during an annual well check? The services and screenings done by your health care provider during your annual well check will depend on your age, overall health, lifestyle risk factors, and family history of disease. Counseling  Your health care provider may ask you questions about your:  Alcohol use.  Tobacco use.  Drug use.  Emotional well-being.  Home and relationship well-being.  Sexual activity.  Eating habits.  Work and work Statistician. Screening  You may have the following tests or measurements:  Height, weight, and BMI.  Blood pressure.  Lipid and cholesterol levels. These may be checked every 5 years starting at age 35.  Diabetes screening. This is done by checking your blood sugar (glucose) after you have not eaten for a while (fasting).  Skin check.  Hepatitis C blood test.  Hepatitis B blood test.  Sexually transmitted disease (STD)  testing. Discuss your test results, treatment options, and if necessary, the need for more tests with your health care provider. Vaccines  Your health care provider may recommend certain vaccines, such as:  Influenza vaccine. This is recommended every year.  Tetanus, diphtheria, and acellular pertussis (Tdap, Td) vaccine. You may need a Td booster every 10 years.  Varicella vaccine. You may need this if you have not been vaccinated.  HPV vaccine. If you are 10 or younger, you may need three doses over 6 months.  Measles, mumps, and rubella (MMR) vaccine. You may need at least one dose of MMR.You may also need a second dose.  Pneumococcal 13-valent conjugate (PCV13) vaccine. You may need this if you have certain conditions and have not been vaccinated.  Pneumococcal polysaccharide (PPSV23) vaccine. You may need one or two doses if you smoke cigarettes or if you have certain conditions.  Meningococcal vaccine. One dose is recommended if you are age 23-21 years and a first-year college student living in a residence hall, or if you have one of several medical conditions. You may also need additional booster doses.  Hepatitis A vaccine. You may need this if you have certain conditions or if you travel or work in places where you may be exposed to hepatitis A.  Hepatitis B vaccine. You may need this if you have certain conditions or if you travel or work in places where you may be exposed to hepatitis B.  Haemophilus influenzae type b (Hib) vaccine. You may need this if you have certain risk factors. Talk to your  health care provider about which screenings and vaccines you need and how often you need them. This information is not intended to replace advice given to you by your health care provider. Make sure you discuss any questions you have with your health care provider. Document Released: 02/06/2002 Document Revised: 08/30/2016 Document Reviewed: 10/12/2015 Elsevier Interactive Patient  Education  2017 [employer].

## 2017-04-24 NOTE — Assessment & Plan Note (Signed)
Repeat labs today. Working on diet and exercise recommendations.

## 2017-04-24 NOTE — Assessment & Plan Note (Signed)
Depression screen negative. Health Maintenance reviewed -- Immunizations up-to-date. Preventive schedule discussed and handout given in AVS. Will obtain fasting labs today.  

## 2017-04-24 NOTE — Progress Notes (Signed)
Patient presents to clinic today for annual exam.  Patient is fasting for labs. Body mass index is 36.03 kg/m. Diet -- Patient endorses significant decrease in junk food. Eating lean proteins and good vegetable intake. Is trying to avoid processed foods. Is drinking mainly water and sometimes seltzer water. Exercise --  Patient has recently restarted gym membership -- working out 3 x week.   Chronic Issues: Hyperlipidemia -- Was on lipitor previously. Is not taking. Is working hard on diet and exercise as noted above.   History of Nephrolithiasis -- Notes history of 4 stones since 2012. Is followed by Urology. No current symptoms. Is wondering why he is having so frequently.  Health Maintenance: Immunizations -- up-to-date.   Past Medical History:  Diagnosis Date  . Asthma    Childhood -- Resolved.  . Hyperlipemia   . Kidney stones 2012  . Kidney stones     Past Surgical History:  Procedure Laterality Date  . NO PAST SURGERIES      No current outpatient prescriptions on file prior to visit.   No current facility-administered medications on file prior to visit.     Allergies  Allergen Reactions  . Sulfa Antibiotics Rash    Family History  Problem Relation Age of Onset  . Healthy Mother     Living  . Healthy Father     Living  . Diabetes Maternal Grandmother   . Alzheimer's disease Paternal Grandfather   . Stroke Paternal Grandmother   . Diabetes Maternal Uncle   . Healthy Brother     Social History   Social History  . Marital status: Single    Spouse name: N/A  . Number of children: N/A  . Years of education: N/A   Occupational History  . Not on file.   Social History Main Topics  . Smoking status: Never Smoker  . Smokeless tobacco: Never Used  . Alcohol use 0.0 oz/week     Comment: socially  . Drug use: No  . Sexual activity: Yes    Partners: Female   Other Topics Concern  . Not on file   Social History Narrative  . No narrative on file    Review of Systems  Constitutional: Negative for fever and weight loss.  HENT: Negative for ear discharge, ear pain, hearing loss and tinnitus.   Eyes: Negative for blurred vision, double vision, photophobia and pain.  Respiratory: Negative for cough and shortness of breath.   Cardiovascular: Negative for chest pain and palpitations.  Gastrointestinal: Negative for abdominal pain, blood in stool, constipation, diarrhea, heartburn, melena, nausea and vomiting.  Genitourinary: Negative for dysuria, flank pain, frequency, hematuria and urgency.  Musculoskeletal: Negative for falls.  Neurological: Negative for dizziness, loss of consciousness and headaches.  Endo/Heme/Allergies: Negative for environmental allergies.  Psychiatric/Behavioral: Negative for depression, hallucinations, substance abuse and suicidal ideas. The patient is not nervous/anxious and does not have insomnia.    BP 104/80   Pulse 70   Temp 98.8 F (37.1 C) (Oral)   Resp 14   Ht  (1.753 m)   Wt 244 lb (110.7 kg)   SpO2 95%   BMI 36.03 kg/m   Physical Exam  Constitutional: He is oriented to person, place, and time and well-developed, well-nourished, and in no distress.  HENT:  Head: Normocephalic and atraumatic.  Right Ear: External ear normal.  Left Ear: External ear normal.  Nose: Nose normal.  Mouth/Throat: Oropharynx is clear and moist. No oropharyngeal exudate.  Eyes: Conjunctivae  and EOM are normal. Pupils are equal, round, and reactive to light.  Neck: Neck supple. No thyromegaly present.  Cardiovascular: Normal rate, regular rhythm, normal heart sounds and intact distal pulses.   Pulmonary/Chest: Effort normal and breath sounds normal. No respiratory distress. He has no wheezes. He has no rales. He exhibits no tenderness.  Abdominal: Soft. Bowel sounds are normal. He exhibits no distension and no mass. There is no tenderness. There is no rebound and no guarding.  Genitourinary: Testes/scrotum normal.    Lymphadenopathy:    He has no cervical adenopathy.  Neurological: He is alert and oriented to person, place, and time.  Skin: Skin is warm and dry. No rash noted.  Psychiatric: Affect normal.  Vitals reviewed.   Recent Results (from the past 2160 hour(s))  Urinalysis, Routine w reflex microscopic     Status: Abnormal   Collection Time: 02/27/17 10:23 PM  Result Value Ref Range   Color, Urine STRAW (A) YELLOW   APPearance CLEAR CLEAR   Specific Gravity, Urine 1.006 1.005 - 1.030   pH 5.0 5.0 - 8.0   Glucose, UA NEGATIVE NEGATIVE mg/dL   Hgb urine dipstick LARGE (A) NEGATIVE   Bilirubin Urine NEGATIVE NEGATIVE   Ketones, ur NEGATIVE NEGATIVE mg/dL   Protein, ur NEGATIVE NEGATIVE mg/dL   Nitrite NEGATIVE NEGATIVE   Leukocytes, UA SMALL (A) NEGATIVE   RBC / HPF 0-5 0 - 5 RBC/hpf   WBC, UA 0-5 0 - 5 WBC/hpf   Bacteria, UA NONE SEEN NONE SEEN   Squamous Epithelial / LPF 0-5 (A) NONE SEEN   Mucous PRESENT   I-stat chem 8, ed     Status: Abnormal   Collection Time: 02/27/17 11:15 PM  Result Value Ref Range   Sodium 140 135 - 145 mmol/L   Potassium 4.3 3.5 - 5.1 mmol/L   Chloride 101 101 - 111 mmol/L   BUN 23 (H) 6 - 20 mg/dL   Creatinine, Ser 5.62 (H) 0.61 - 1.24 mg/dL   Glucose, Bld 89 65 - 99 mg/dL   Calcium, Ion 1.30 8.65 - 1.40 mmol/L   TCO2 28 0 - 100 mmol/L   Hemoglobin 16.7 13.0 - 17.0 g/dL   HCT 78.4 69.6 - 29.5 %    Assessment/Plan: Visit for preventive health examination Depression screen negative. Health Maintenance reviewed -- Immunizations up-to-date. Preventive schedule discussed and handout given in AVS. Will obtain fasting labs today.   Hyperlipidemia Repeat labs today. Working on diet and exercise recommendations.     Piedad Climes, PA-C

## 2017-04-27 ENCOUNTER — Other Ambulatory Visit: Payer: Self-pay | Admitting: *Deleted

## 2017-04-27 DIAGNOSIS — E785 Hyperlipidemia, unspecified: Secondary | ICD-10-CM

## 2017-04-27 MED ORDER — ATORVASTATIN CALCIUM 20 MG PO TABS: 20.0000 mg | ORAL_TABLET | Freq: Every day | ORAL | 3 refills | Status: DC

## 2017-06-08 ENCOUNTER — Other Ambulatory Visit: Payer: 59

## 2017-08-09 ENCOUNTER — Other Ambulatory Visit: Payer: Self-pay | Admitting: Physician Assistant

## 2017-08-09 DIAGNOSIS — E785 Hyperlipidemia, unspecified: Secondary | ICD-10-CM

## 2017-08-10 ENCOUNTER — Encounter: Payer: Self-pay | Admitting: Emergency Medicine

## 2017-08-10 NOTE — Telephone Encounter (Signed)
Patient is due for a lab visit to check liver test and cholesterol

## 2017-09-10 ENCOUNTER — Ambulatory Visit (INDEPENDENT_AMBULATORY_CARE_PROVIDER_SITE_OTHER): Payer: 59 | Admitting: Physician Assistant

## 2017-09-10 ENCOUNTER — Encounter: Payer: Self-pay | Admitting: Physician Assistant

## 2017-09-10 ENCOUNTER — Ambulatory Visit: Payer: Self-pay | Admitting: Physician Assistant

## 2017-09-10 VITALS — BP 122/88 | HR 93 | Temp 100.2°F | Resp 14 | Ht 69.0 in | Wt 244.0 lb

## 2017-09-10 DIAGNOSIS — K648 Other hemorrhoids: Secondary | ICD-10-CM

## 2017-09-10 DIAGNOSIS — R41840 Attention and concentration deficit: Secondary | ICD-10-CM | POA: Diagnosis not present

## 2017-09-10 MED ORDER — HYDROCORTISONE ACETATE 25 MG RE SUPP: 25.0000 mg | Freq: Two times a day (BID) | RECTAL | 0 refills | Status: DC

## 2017-09-10 NOTE — Progress Notes (Signed)
   Patient presents to clinic today c/o intermittent BRBPR noted on tissue paper when wiping over the past 2 weeks. Noted some occasional episodes of this over the past 6 months, occurring about 1 x month. Denies melena or hematochezia. Denies constipation, tenesmus. Eats a high fiber diet and is staying well-hydrated. Denies abdominal pain, nausea or vomiting. Denies change to urinary habits.   Patient also wanting to have adult ADD assessment.    Past Medical History:  Diagnosis Date  . Asthma    Childhood -- Resolved.  . Hyperlipemia   . Kidney stones 2012  . Kidney stones     Current Outpatient Prescriptions on File Prior to Visit  Medication Sig Dispense Refill  . atorvastatin (LIPITOR) 20 MG tablet TAKE 1 TABLET(20 MG) BY MOUTH DAILY 30 tablet 0   No current facility-administered medications on file prior to visit.     Allergies  Allergen Reactions  . Sulfa Antibiotics Rash    Family History  Problem Relation Age of Onset  . Healthy Mother        Living  . Healthy Father        Living  . Diabetes Maternal Grandmother   . Alzheimer's disease Paternal Grandfather   . Stroke Paternal Grandmother   . Diabetes Maternal Uncle   . Healthy Brother     Social History   Social History  . Marital status: Single    Spouse name: N/A  . Number of children: N/A  . Years of education: N/A   Social History Main Topics  . Smoking status: Never Smoker  . Smokeless tobacco: Never Used  . Alcohol use 0.0 oz/week     Comment: socially  . Drug use: No  . Sexual activity: Yes    Partners: Female   Other Topics Concern  . None   Social History Narrative  . None   Review of Systems - See HPI.  All other ROS are negative.  BP 122/88   Pulse 93   Temp 100.2 F (37.9 C) (Oral)   Resp 14   Ht  (1.753 m)   Wt 244 lb (110.7 kg)   SpO2 97%   BMI 36.03 kg/m   Physical Exam  Constitutional: He is oriented to person, place, and time and well-developed,  well-nourished, and in no distress.  HENT:  Head: Normocephalic and atraumatic.  Eyes: Conjunctivae are normal.  Neck: Neck supple.  Cardiovascular: Normal rate, regular rhythm, normal heart sounds and intact distal pulses.   Pulmonary/Chest: Effort normal and breath sounds normal. No respiratory distress. He has no wheezes. He has no rales. He exhibits no tenderness.  Genitourinary: Rectal exam shows internal hemorrhoid. Rectal exam shows no external hemorrhoid, no fissure and no laceration.  Neurological: He is alert and oriented to person, place, and time.  Skin: Skin is warm and dry. No rash noted.  Vitals reviewed.  Assessment/Plan: 1. Internal hemorrhoid Start Hydrocortisone suppository. Supportive measures and OTC medications reviewed. If not resolving, will need referral to colorectal surgery. - hydrocortisone (ANUSOL-HC) 25 MG suppository; Place 1 suppository (25 mg total) rectally 2 (two) times daily.  Dispense: 12 suppository; Refill: 0  2. Inattention Handout given with contact information to schedule formal ADD assessment.    Piedad Climes, PA-C

## 2017-09-10 NOTE — Progress Notes (Signed)
Pre visit review using our clinic review tool, if applicable. No additional management support is needed unless otherwise documented below in the visit note. 

## 2017-09-10 NOTE — Patient Instructions (Signed)
Please stay well-hydrated and get plenty of rest.  Continue a good fiber diet. Avoid sitting on the toilet for prolonged periods of time. Use the steroid suppository as directed. If not resolving, please let me know. I will set you up with a rectal specialist.   Please use the handout given to schedule an ADD evaluation with one of our providers.

## 2017-10-09 ENCOUNTER — Ambulatory Visit (INDEPENDENT_AMBULATORY_CARE_PROVIDER_SITE_OTHER): Payer: 59 | Admitting: Psychology

## 2017-10-09 DIAGNOSIS — F909 Attention-deficit hyperactivity disorder, unspecified type: Secondary | ICD-10-CM

## 2017-10-09 DIAGNOSIS — F900 Attention-deficit hyperactivity disorder, predominantly inattentive type: Secondary | ICD-10-CM

## 2017-10-12 ENCOUNTER — Encounter: Payer: Self-pay | Admitting: Physician Assistant

## 2017-10-12 ENCOUNTER — Ambulatory Visit (INDEPENDENT_AMBULATORY_CARE_PROVIDER_SITE_OTHER): Payer: 59 | Admitting: Physician Assistant

## 2017-10-12 VITALS — BP 112/72 | HR 72 | Temp 98.8°F | Resp 14 | Ht 69.0 in | Wt 246.0 lb

## 2017-10-12 DIAGNOSIS — F900 Attention-deficit hyperactivity disorder, predominantly inattentive type: Secondary | ICD-10-CM

## 2017-10-12 MED ORDER — LISDEXAMFETAMINE DIMESYLATE 30 MG PO CAPS: 30.0000 mg | ORAL_CAPSULE | Freq: Every day | ORAL | 0 refills | Status: DC

## 2017-10-12 NOTE — Patient Instructions (Signed)
Please start the medication daily as directed. Take in the morning 30 minutes to an hour before work shift.  Follow-up with me in 3-4 weeks for reassessment.  I will call you with your full report results from Dr. Reggy EyeAltabet!

## 2017-10-12 NOTE — Progress Notes (Signed)
    Patient presents to clinic today to discuss diagnosis of ADHD and treatment options. Patient recently seen and assessed by Dr. Reggy EyeAltabet, PsyD. Was given diagnosis of ADHD and encouraged to follow-up here for initiation of medication. Patient endorses working split shifts at work. Works 8-12 am and 4-8 pm. Would need medication to help with these shifts.   Past Medical History:  Diagnosis Date  . Asthma    Childhood -- Resolved.  . Hyperlipemia   . Kidney stones 2012  . Kidney stones     Current Outpatient Prescriptions on File Prior to Visit  Medication Sig Dispense Refill  . atorvastatin (LIPITOR) 20 MG tablet TAKE 1 TABLET(20 MG) BY MOUTH DAILY 30 tablet 0   No current facility-administered medications on file prior to visit.     Allergies  Allergen Reactions  . Sulfa Antibiotics Rash    Family History  Problem Relation Age of Onset  . Healthy Mother        Living  . Healthy Father        Living  . Diabetes Maternal Grandmother   . Alzheimer's disease Paternal Grandfather   . Stroke Paternal Grandmother   . Diabetes Maternal Uncle   . Healthy Brother     Social History   Social History  . Marital status: Single    Spouse name: N/A  . Number of children: N/A  . Years of education: N/A   Social History Main Topics  . Smoking status: Never Smoker  . Smokeless tobacco: Never Used  . Alcohol use 0.0 oz/week     Comment: socially  . Drug use: No  . Sexual activity: Yes    Partners: Female   Other Topics Concern  . None   Social History Narrative  . None   Review of Systems - See HPI.  All other ROS are negative.  BP 112/72   Pulse 72   Temp 98.8 F (37.1 C) (Oral)   Resp 14   Ht 5\' 9"  (1.753 m)   Wt 246 lb (111.6 kg)   SpO2 98%   BMI 36.33 kg/m   Physical Exam  Constitutional: He is oriented to person, place, and time and well-developed, well-nourished, and in no distress.  HENT:  Head: Normocephalic and atraumatic.  Right Ear: External ear  normal.  Left Ear: External ear normal.  Nose: Nose normal.  Mouth/Throat: Oropharynx is clear and moist. No oropharyngeal exudate.  TM within normal limits bilaterally.  Eyes: Conjunctivae are normal.  Neck: Neck supple.  Cardiovascular: Normal rate, regular rhythm, normal heart sounds and intact distal pulses.   Pulmonary/Chest: Effort normal and breath sounds normal. No respiratory distress. He has no wheezes. He has no rales. He exhibits no tenderness.  Neurological: He is alert and oriented to person, place, and time.  Skin: Skin is warm and dry. No rash noted.  Psychiatric: Affect normal.  Vitals reviewed.  Assessment/Plan: 1. Attention deficit hyperactivity disorder (ADHD), predominantly inattentive type Discussed options. Will start Vyvanse daily to cover him through both work shifts. Close follow-up scheduled.    Piedad ClimesMartin, Adna Nofziger [name], PA-C

## 2017-10-12 NOTE — Progress Notes (Signed)
Pre visit review using our clinic review tool, if applicable. No additional management support is needed unless otherwise documented below in the visit note. 

## 2017-10-19 ENCOUNTER — Telehealth: Payer: Self-pay | Admitting: Physician Assistant

## 2017-10-19 NOTE — Telephone Encounter (Signed)
[facility] to discuss possible increasing dosage of lisdexamfetamine (VYVANSE) 30 MG capsule.  States he was told to call the office if he felt the medication was not working effectively.

## 2017-10-19 NOTE — Telephone Encounter (Signed)
LMOVM advising status of he is doing on the medication.

## 2017-10-19 NOTE — Telephone Encounter (Signed)
Spoke with patient he states he can not tell any changes since starting the Vyvanse. Advised to continue the medication for another week. If still no improvements then to notify the office and will discuss with PCP if we need to increase dosage or change medication. He is agreeable. He needs a follow up appointment for 3-4 weeks.

## 2017-10-19 NOTE — Telephone Encounter (Signed)
He needs to give the medication a little more time. Is he noting some improvement or?

## 2017-10-23 ENCOUNTER — Telehealth: Payer: Self-pay | Admitting: Physician Assistant

## 2017-10-23 DIAGNOSIS — F909 Attention-deficit hyperactivity disorder, unspecified type: Secondary | ICD-10-CM

## 2017-10-23 NOTE — Telephone Encounter (Signed)
Pt would like a call back from [facility] regarding an increase that he would like to do on medication that he is taking.

## 2017-10-23 NOTE — Telephone Encounter (Signed)
Please assess patient concerns. We can increase to 50 mg daily to see how he does. Ok to print Rx for 30 capsules to see how this does for him.

## 2017-10-24 ENCOUNTER — Telehealth: Payer: Self-pay | Admitting: Physician Assistant

## 2017-10-24 MED ORDER — LISDEXAMFETAMINE DIMESYLATE 50 MG PO CAPS: 50.0000 mg | ORAL_CAPSULE | Freq: Every day | ORAL | 0 refills | Status: DC

## 2017-10-24 NOTE — Telephone Encounter (Signed)
Advised patient since he has not seen any improvements in symptoms on the 30 mg. Per PCP will increase Vyvanse to 50 mg. He is agreeable. Rx is ready at the front desk.

## 2017-10-24 NOTE — Telephone Encounter (Signed)
Patient states he just picked up script for lisdexamfetamine (VYVANSE) 50 MG capsule from office.  He has taken script to pharmacy, however, they will need verbal approval from pcp before filling since patient received another script earlier this month for a lower dosage of this medication.  Please contact pharmacy to provide verbal approval.  [facility]  [facility] [id number] [address] [place], [place] - [id number] [facility] [address] [phone] (Phone) [phone] (Fax)

## 2017-10-24 NOTE — Telephone Encounter (Signed)
Called patient pharmacy to give the verbal ok to fill the Vyvanse 50 mg rx. We were increasing the dosage of the medication. Per pharmacy they will fill the rx

## 2017-10-26 ENCOUNTER — Ambulatory Visit: Payer: 59 | Admitting: Psychology

## 2017-11-08 ENCOUNTER — Ambulatory Visit: Payer: Self-pay | Admitting: Physician Assistant

## 2017-11-09 ENCOUNTER — Ambulatory Visit: Payer: 59 | Admitting: Physician Assistant

## 2017-11-09 ENCOUNTER — Other Ambulatory Visit: Payer: Self-pay

## 2017-11-09 ENCOUNTER — Encounter: Payer: Self-pay | Admitting: Physician Assistant

## 2017-11-09 DIAGNOSIS — Z23 Encounter for immunization: Secondary | ICD-10-CM

## 2017-11-09 DIAGNOSIS — F909 Attention-deficit hyperactivity disorder, unspecified type: Secondary | ICD-10-CM | POA: Diagnosis not present

## 2017-11-09 MED ORDER — AMPHETAMINE-DEXTROAMPHET ER 30 MG PO CP24: 30.0000 mg | ORAL_CAPSULE | Freq: Every day | ORAL | 0 refills | Status: DC

## 2017-11-09 NOTE — Assessment & Plan Note (Signed)
Will stop Vyvanse and Start Adderall XR 30 mg daily. Close follow-up scheduled.

## 2017-11-09 NOTE — Patient Instructions (Signed)
Please stop the Vyvanse and start the Adderall XR as directed. We will follow-up in 1 month as long as you are noting improvement. Do not hesitate to contact me in the mean time if needed.

## 2017-11-09 NOTE — Progress Notes (Signed)
   Patient presents to clinic today for follow-up of ADHD after addition of Vyvanse. Patient was initially placed on 30 mg with no improvement. Subsequently changed to 50 mg daily. Is taking daily as directed without side effects. Has not noted improvement in symptoms. Would like to discuss other options.  Past Medical History:  Diagnosis Date  . Asthma    Childhood -- Resolved.  . Hyperlipemia   . Kidney stones 2012  . Kidney stones     Current Outpatient Medications on File Prior to Visit  Medication Sig Dispense Refill  . atorvastatin (LIPITOR) 20 MG tablet TAKE 1 TABLET(20 MG) BY MOUTH DAILY 30 tablet 0   No current facility-administered medications on file prior to visit.     Allergies  Allergen Reactions  . Sulfa Antibiotics Rash    Family History  Problem Relation Age of Onset  . Healthy Mother        Living  . Healthy Father        Living  . Diabetes Maternal Grandmother   . Alzheimer's disease Paternal Grandfather   . Stroke Paternal Grandmother   . Diabetes Maternal Uncle   . Healthy Brother     Social History   Socioeconomic History  . Marital status: Single    Spouse name: None  . Number of children: None  . Years of education: None  . Highest education level: None  Social Needs  . Financial resource strain: None  . Food insecurity - worry: None  . Food insecurity - inability: None  . Transportation needs - medical: None  . Transportation needs - non-medical: None  Occupational History  . None  Tobacco Use  . Smoking status: Never Smoker  . Smokeless tobacco: Never Used  Substance and Sexual Activity  . Alcohol use: Yes    Alcohol/week: 0.0 oz    Comment: socially  . Drug use: No  . Sexual activity: Yes    Partners: Female  Other Topics Concern  . None  Social History Narrative  . None   Review of Systems - See HPI.  All other ROS are negative.  BP 110/70   Pulse 94   Temp 98.7 F (37.1 C) (Oral)   Resp 14   Ht 5\' 9"  (1.753 m)    Wt 240 lb (108.9 kg)   SpO2 95%   BMI 35.44 kg/m   Physical Exam  Constitutional: He is oriented to person, place, and time and well-developed, well-nourished, and in no distress.  HENT:  Head: Normocephalic and atraumatic.  Eyes: Conjunctivae are normal.  Cardiovascular: Normal rate, regular rhythm, normal heart sounds and intact distal pulses.  Pulmonary/Chest: Effort normal.  Neurological: He is alert and oriented to person, place, and time.  Skin: Skin is warm and dry. No rash noted.  Psychiatric: Affect normal.  Vitals reviewed.  Assessment/Plan: ADHD Will stop Vyvanse and Start Adderall XR 30 mg daily. Close follow-up scheduled.    Piedad ClimesWilliam [name] Gan, PA-C

## 2017-11-09 NOTE — Progress Notes (Signed)
Pre visit review using our clinic review tool, if applicable. No additional management support is needed unless otherwise documented below in the visit note. 

## 2017-12-05 ENCOUNTER — Other Ambulatory Visit: Payer: Self-pay | Admitting: Physician Assistant

## 2017-12-05 DIAGNOSIS — E785 Hyperlipidemia, unspecified: Secondary | ICD-10-CM

## 2017-12-13 ENCOUNTER — Other Ambulatory Visit: Payer: Self-pay | Admitting: Physician Assistant

## 2017-12-13 NOTE — Telephone Encounter (Signed)
Adderall   Dosage  Change request

## 2017-12-13 NOTE — Telephone Encounter (Signed)
Copied from CRM [phone]. Topic: Inquiry >> Dec 13, 2017 12:29 PM Viviann SpareWhite, Selina wrote: Reason for CRM: Patient called and would like for Malva Coganody Martin assistant to give him a call. The call is in reference to have his amphetamine-dextroamphetamine (ADDERALL XR) 30 MG 24 hr capsule refill and having the mg increased.  [facility] [id number] [address]- [place], [place] - [id number] AT [facility] [address] [place] [address] Phone: [phone] Fax: [phone]

## 2017-12-13 NOTE — Telephone Encounter (Signed)
Patient states that he cannot see a big difference since being on the 30mg .  He is asking if it is possible to increase the dosage of medication.

## 2017-12-14 MED ORDER — AMPHETAMINE-DEXTROAMPHET ER 20 MG PO CP24: 40.0000 mg | ORAL_CAPSULE | Freq: Every day | ORAL | 0 refills | Status: DC

## 2017-12-14 NOTE — Addendum Note (Signed)
Addended by: Waldon MerlMARTIN, Donasia Wimes C on: 12/14/2017 07:41 AM   Modules accepted: Orders

## 2017-12-14 NOTE — Telephone Encounter (Signed)
Patient is aware that medication has been sent, and he is aware to take 2 daily.      Follow-up appt has been made for 01/14/17 at 11:30am.

## 2017-12-14 NOTE — Telephone Encounter (Signed)
I will increase to 40 mg -- he will have to take two of the 20 mg dose daily. I have sent in prescription electronically using the [employer] software so he will not have to come pick up in office. Follow-up in 3-4 weeks.

## 2017-12-17 ENCOUNTER — Other Ambulatory Visit: Payer: Self-pay | Admitting: Physician Assistant

## 2017-12-17 ENCOUNTER — Telehealth: Payer: Self-pay | Admitting: General Practice

## 2017-12-17 NOTE — Telephone Encounter (Signed)
PA approved through 12/17/2018.

## 2017-12-17 NOTE — Telephone Encounter (Signed)
Please see request below

## 2017-12-17 NOTE — Telephone Encounter (Signed)
PA has been submitted through cover my meds  KEY: [id number]  Patient is aware that it has been submitted and that we will call as soon as we have an update.

## 2017-12-17 NOTE — Telephone Encounter (Signed)
PA received for adderall, began today in covermymeds.   Key: [id number] - PA Case ID: [id number] - Rx #: [id number]

## 2017-12-17 NOTE — Telephone Encounter (Signed)
[facility] called about the prior for the  amphetamine-dextroamphetamine (ADDERALL XR) 20 MG 24 hr capsule  They need a dx for this med. This was left off the prescription. [facility] will fax something to you, and that is all they need

## 2017-12-17 NOTE — Telephone Encounter (Signed)
Copied from CRM [id number]. Topic: Quick Communication - See Telephone Encounter >> Dec 17, 2017 10:09 AM Guinevere FerrariMorris, Mariafernanda Hendricksen E, NT wrote: CRM for notification. See Telephone encounter for: Pt wife called and wanted to see if the doctor can give a pre authorization for amphetamine-dextroamphetamine (ADDERALL XR) 20 MG 24 hr capsule because insurance won't pay for the pills twice a day. Pt is out of medication and wife would like a call back. [facility] [address].  12/17/17.

## 2017-12-27 ENCOUNTER — Encounter: Payer: Self-pay | Admitting: Physician Assistant

## 2018-01-14 ENCOUNTER — Ambulatory Visit: Payer: Self-pay | Admitting: Physician Assistant

## 2018-02-18 ENCOUNTER — Other Ambulatory Visit: Payer: Self-pay | Admitting: Physician Assistant

## 2018-02-18 NOTE — Telephone Encounter (Signed)
Copied from CRM [id number]. Topic: Quick Communication - Rx Refill/Question >> Feb 18, 2018 10:11 AM Floria RavelingStovall, Shana A wrote: Medication: amphetamine-dextroamphetamine (ADDERALL XR) 20 MG 24 hr capsule [[id number]][[id number]]   Has the patient contacted their pharmacy? No    (Agent: If no, request that the patient contact the pharmacy for the refill.)   Preferred Pharm[facility] (with phone number or street name[facility] [address]    Agent: Please be advised that RX refills may take up to 3 business days. We ask that you follow-up with your pharmacy.

## 2018-02-18 NOTE — Telephone Encounter (Signed)
Adderall refill Last OV 11/09/17 PCP: Daphine DeutscherMartin, PA [facility]  [facility] [id number] [address] [place] - [address] AT [facility] [address] [phone] (Phone) [phone] (Fax)

## 2018-02-18 NOTE — Telephone Encounter (Signed)
Last refill on 12/14/2017 #60 No CSC No UDS  Please advise

## 2018-02-21 ENCOUNTER — Other Ambulatory Visit: Payer: Self-pay | Admitting: Physician Assistant

## 2018-02-21 ENCOUNTER — Encounter: Payer: Self-pay | Admitting: Emergency Medicine

## 2018-02-21 DIAGNOSIS — Z79899 Other long term (current) drug therapy: Secondary | ICD-10-CM

## 2018-02-21 MED ORDER — AMPHETAMINE-DEXTROAMPHET ER 20 MG PO CP24: 40.0000 mg | ORAL_CAPSULE | Freq: Every day | ORAL | 0 refills | Status: DC

## 2018-02-21 NOTE — Telephone Encounter (Signed)
LMOVM advising patient rx is ready at the front desk. Patient needs to update CSC and give a UDS.

## 2018-02-21 NOTE — Telephone Encounter (Signed)
Refill printed. He will have to pick up this month's Rx as he is due for a UDS and to sign an updated CSC.

## 2018-02-26 ENCOUNTER — Other Ambulatory Visit: Payer: 59

## 2018-02-26 DIAGNOSIS — Z79899 Other long term (current) drug therapy: Secondary | ICD-10-CM | POA: Diagnosis not present

## 2018-02-27 LAB — PAIN MGMT, PROFILE 8 W/CONF, U
6 ACETYLMORPHINE: NEGATIVE ng/mL (ref <10)
Alcohol Metabolites: NEGATIVE ng/mL (ref <500)
Amphetamines: NEGATIVE ng/mL (ref <500)
Benzodiazepines: NEGATIVE ng/mL (ref <100)
Buprenorphine, Urine: NEGATIVE ng/mL (ref <5)
Cocaine Metabolite: NEGATIVE ng/mL (ref <150)
Creatinine: 243 mg/dL
MARIJUANA METABOLITE: NEGATIVE ng/mL (ref <20)
MDMA: NEGATIVE ng/mL (ref <500)
OPIATES: NEGATIVE ng/mL (ref <100)
OXYCODONE: NEGATIVE ng/mL (ref <100)
Oxidant: NEGATIVE ug/mL (ref <200)
pH: 6.37 (ref 4.5–9.0)

## 2018-03-19 ENCOUNTER — Encounter: Payer: Self-pay | Admitting: Physician Assistant

## 2018-03-25 ENCOUNTER — Other Ambulatory Visit: Payer: Self-pay

## 2018-03-25 ENCOUNTER — Telehealth: Payer: Self-pay | Admitting: Physician Assistant

## 2018-03-25 ENCOUNTER — Encounter: Payer: Self-pay | Admitting: Physician Assistant

## 2018-03-25 ENCOUNTER — Other Ambulatory Visit: Payer: Self-pay | Admitting: Physician Assistant

## 2018-03-25 ENCOUNTER — Ambulatory Visit: Payer: 59 | Admitting: Physician Assistant

## 2018-03-25 VITALS — BP 120/80 | HR 73 | Temp 98.1°F | Resp 16 | Ht 69.0 in | Wt 232.0 lb

## 2018-03-25 DIAGNOSIS — F909 Attention-deficit hyperactivity disorder, unspecified type: Secondary | ICD-10-CM | POA: Diagnosis not present

## 2018-03-25 DIAGNOSIS — E785 Hyperlipidemia, unspecified: Secondary | ICD-10-CM | POA: Diagnosis not present

## 2018-03-25 LAB — HEPATIC FUNCTION PANEL
ALBUMIN: 3.9 g/dL (ref 3.5–5.2)
ALK PHOS: 38 U/L — AB (ref 39–117)
ALT: 21 U/L (ref 0–53)
AST: 18 U/L (ref 0–37)
BILIRUBIN DIRECT: 0.1 mg/dL (ref 0.0–0.3)
TOTAL PROTEIN: 6.9 g/dL (ref 6.0–8.3)
Total Bilirubin: 0.3 mg/dL (ref 0.2–1.2)

## 2018-03-25 LAB — LIPID PANEL
CHOLESTEROL: 126 mg/dL (ref 0–200)
HDL: 27.6 mg/dL — AB (ref >39.00)
LDL CALC: 73 mg/dL (ref 0–99)
NonHDL: 98.58
Total CHOL/HDL Ratio: 5
Triglycerides: 128 mg/dL (ref 0.0–149.0)
VLDL: 25.6 mg/dL (ref 0.0–40.0)

## 2018-03-25 MED ORDER — ATORVASTATIN CALCIUM 20 MG PO TABS: 20.0000 mg | ORAL_TABLET | Freq: Every day | ORAL | 0 refills | Status: DC

## 2018-03-25 MED ORDER — BUPROPION HCL ER (XL) 150 MG PO TB24: 150.0000 mg | ORAL_TABLET | Freq: Every day | ORAL | 1 refills | Status: DC

## 2018-03-25 NOTE — Telephone Encounter (Signed)
Copied from CRM [phone]. Topic: Quick Communication - Lab Results >> Mar 25, 2018  4:43 PM Stephannie LiSimmons, Raimundo Corbit L, NT wrote: Patient called to get lab results triage may disclose

## 2018-03-25 NOTE — Progress Notes (Signed)
Patient presents to clinic today to discuss alternative treatments for ADD. Patient is currently on a regimen of Adderall XR 40 mg daily. Is taking as directed. Does help with focus well but is causing significant jitteriness and anorexia. Is also having impact on sleep. Patient has previously been on 20 mg Adderall XR that was not therapeutic.  Has also tried Vyvanse that was not therapeutic. Would like to discuss non-stimulant therapy.  Past Medical History:  Diagnosis Date  . Asthma    Childhood -- Resolved.  . Hyperlipemia   . Kidney stones 2012  . Kidney stones     Current Outpatient Medications on File Prior to Visit  Medication Sig Dispense Refill  . amphetamine-dextroamphetamine (ADDERALL XR) 20 MG 24 hr capsule Take 2 capsules (40 mg total) by mouth daily. 60 capsule 0   No current facility-administered medications on file prior to visit.     Allergies  Allergen Reactions  . Sulfa Antibiotics Rash    Family History  Problem Relation Age of Onset  . Healthy Mother        Living  . Healthy Father        Living  . Diabetes Maternal Grandmother   . Alzheimer's disease Paternal Grandfather   . Stroke Paternal Grandmother   . Diabetes Maternal Uncle   . Healthy Brother     Social History   Socioeconomic History  . Marital status: Single    Spouse name: Not on file  . Number of children: Not on file  . Years of education: Not on file  . Highest education level: Not on file  Occupational History  . Not on file  Social Needs  . Financial resource strain: Not on file  . Food insecurity:    Worry: Not on file    Inability: Not on file  . Transportation needs:    Medical: Not on file    Non-medical: Not on file  Tobacco Use  . Smoking status: Never Smoker  . Smokeless tobacco: Never Used  Substance and Sexual Activity  . Alcohol use: Yes    Alcohol/week: 0.0 oz    Comment: socially  . Drug use: No  . Sexual activity: Yes    Partners: Female  Lifestyle    . Physical activity:    Days per week: Not on file    Minutes per session: Not on file  . Stress: Not on file  Relationships  . Social connections:    Talks on phone: Not on file    Gets together: Not on file    Attends religious service: Not on file    Active member of club or organization: Not on file    Attends meetings of clubs or organizations: Not on file    Relationship status: Not on file  Other Topics Concern  . Not on file  Social History Narrative  . Not on file   Review of Systems - See HPI.  All other ROS are negative.  BP 120/80   Pulse 73   Temp 98.1 F (36.7 C) (Oral)   Resp 16   Ht 5\' 9"  (1.753 m)   Wt 232 lb (105.2 kg)   SpO2 98%   BMI 34.26 kg/m   Physical Exam  Constitutional: He is oriented to person, place, and time and well-developed, well-nourished, and in no distress.  HENT:  Head: Normocephalic and atraumatic.  Eyes: Conjunctivae are normal.  Neck: Neck supple.  Cardiovascular: Normal rate, regular rhythm, normal heart sounds and  intact distal pulses.  Pulmonary/Chest: Effort normal and breath sounds normal. No respiratory distress. He has no wheezes. He has no rales. He exhibits no tenderness.  Neurological: He is alert and oriented to person, place, and time.  Skin: Skin is warm and dry. No rash noted.  Psychiatric: Affect normal.  Vitals reviewed.  Recent Results (from the past 2160 hour(s))  Pain Mgmt, Profile 8 w/Conf, U     Status: None   Collection Time: 02/26/18 10:06 AM  Result Value Ref Range   Creatinine 243.0 > or = 20. mg/dL   pH 1.616.37 4.5 - 9.0   Oxidant NEGATIVE <200 mcg/mL   Amphetamines NEGATIVE <500 ng/mL   medMATCH Amphetamines CONSISTENT    Benzodiazepines NEGATIVE <100 ng/mL   medMATCH Benzodiazepines CONSISTENT    Marijuana Metabolite NEGATIVE <20 ng/mL   medMATCH Marijuana Metab CONSISTENT    Cocaine Metabolite NEGATIVE <150 ng/mL   medMATCH Cocaine Metab CONSISTENT    Opiates NEGATIVE <100 ng/mL   medMATCH  Opiates CONSISTENT    Oxycodone NEGATIVE <100 ng/mL   medMATCH Oxycodone CONSISTENT    Buprenorphine, Urine NEGATIVE <5 ng/mL   medMATCH Buprenorphine CONSISTENT    MDMA NEGATIVE <500 ng/mL   [facility] MDMA [facility]    Alcohol Metabolites NEGATIVE <500 ng/mL   medMATCH Alcohol Metab CONSISTENT    6 Acetylmorphine NEGATIVE <10 ng/mL   medMATCH 6 Acetylmorphine CONSISTENT     Comment: This drug testing is for medical treatment only.   Analysis was performed as non-forensic testing and  these results should be used only by healthcare  providers to render diagnosis or treatment, or to  monitor progress of medical conditions. Sharyn Lull. medMATCH comments are:  - present when drug test results may be the result of     metabolism of one or more drugs or when results are     inconsistent with prescribed medication(s) listed.  - may be blank when drug results are consistent with     prescribed medication(s) listed. . For assistance with interpreting these drug results,  please contact a [facility] Toxicology  Specialist: [phone] TOX [phone]), M-F,  8am-6pm EST. This drug testing is for medical treatment only.   Analysis was performed as non-forensic testing and  these results should be used only by healthcare  providers to render diagnosis or treatment, or to  monitor progress of medical conditions. Sharyn Lull. medMATCH comments are:  - present when  drug test results may be the result of     metabolism of one or more drugs or when results are     inconsistent with prescribed medication(s) listed.  - may be blank when drug results are consistent with     prescribed medication(s) listed. . For assistance with interpreting these drug results,  please contact a [facility]  Specialist: [phone] TOX [phone]), M-F,  8am-6pm EST. This drug testing is for medical treatment only.   Analysis was performed as non-forensic testing and  these results should be  used only by healthcare  providers to render diagnosis or treatment, or to  monitor progress of medical conditions. Sharyn Lull. medMATCH comments are:  - present when drug test results may be the result of     metabolism of one or more drugs or when results are     inconsistent with prescribed medication(s) listed.  - may be blank when drug results are consistent with     prescribed medication(s) listed. . For assistance with interpreting these d rug results,  please contact  a Development worker, international aid  Specialist: [phone] TOX [phone]), M-F,  8am-6pm EST. This drug testing is for medical treatment only.   Analysis was performed as non-forensic testing and  these results should be used only by healthcare  providers to render diagnosis or treatment, or to  monitor progress of medical conditions. Sharyn Lull comments are:  - present when drug test results may be the result of     metabolism of one or more drugs or when results are     inconsistent with prescribed medication(s) listed.  - may be blank when drug results are consistent with     prescribed medication(s) listed. . For assistance with interpreting these drug results,  please contact a [facility] Toxicology  Specialist: [phone] TOX [phone]), M-F,  8am-6pm EST. This drug testing is for medical treatment only.   Analysis was performed as non-forensic testing and  these results should be used only by healthcare  provi ders to render diagnosis or treatment, or to  monitor progress of medical conditions. Sharyn Lull comments are:  - present when drug test results may be the result of     metabolism of one or more drugs or when results are     inconsistent with prescribed medication(s) listed.  - may be blank when drug results are consistent with     prescribed medication(s) listed. . For assistance with interpreting these drug results,  please contact a [facility] Toxicology  Specialist:  [phone] TOX [phone]), M-F,  8am-6pm EST.     Assessment/Plan: ADHD Discussed options for treatment. Will start Wellbutrin XL 150 mg due to being more cost effective. Will see how he tolerates over the next 2 weeks. If tolerating well, will increase to 300 mg daily.   Hyperlipidemia Taking statin as directed. Repeat fasting lipids and LFT today.    Piedad Climes, PA-C

## 2018-03-25 NOTE — Assessment & Plan Note (Signed)
Discussed options for treatment. Will start Wellbutrin XL 150 mg due to being more cost effective. Will see how he tolerates over the next 2 weeks. If tolerating well, will increase to 300 mg daily.

## 2018-03-25 NOTE — Patient Instructions (Signed)
Please go to the lab today for blood work.  I will call you with your results. We will alter treatment regimen(s) if indicated by your results.   Please continue the Atorvastatin for cholesterol.  Keep up with diet and exercise. This is very important.   Start the Wellbutrin once daily for attention. We want to make sure that you tolerate well over the next 1-2 weeks before increasing the dose.  Please call me to let you know how you are doing so we can alter the dose.   If this is not helping, we should be able to convince insurance to cover the Strattera.

## 2018-03-25 NOTE — Assessment & Plan Note (Signed)
Taking statin as directed. Repeat fasting lipids and LFT today.

## 2018-03-26 NOTE — Telephone Encounter (Signed)
Left VM to call back for results.

## 2018-03-27 ENCOUNTER — Encounter: Payer: Self-pay | Admitting: Emergency Medicine

## 2018-05-28 ENCOUNTER — Other Ambulatory Visit: Payer: Self-pay | Admitting: Physician Assistant

## 2018-05-29 ENCOUNTER — Encounter: Payer: Self-pay | Admitting: Emergency Medicine

## 2018-05-29 NOTE — Telephone Encounter (Signed)
Sent patient a My chart message for to let us know how he is tolerating the Wellbutrin and if we need to increase the dose.

## 2018-05-31 NOTE — Telephone Encounter (Signed)
LMOVM advising patient if current dose of the Wellbutrin is helping with ADD symptoms. Do we need to increase the dosage.

## 2018-06-03 NOTE — Telephone Encounter (Signed)
Patient wife called back and said that pt wellbutrin is helping him. He is doing well with the doze that he is current on. When he comes to his next appt will talk about maybe increasing the med. Please refill med refill. Pt is completely out please refill med as soon as possible.

## 2018-07-05 ENCOUNTER — Other Ambulatory Visit: Payer: Self-pay | Admitting: Physician Assistant

## 2018-07-05 DIAGNOSIS — E785 Hyperlipidemia, unspecified: Secondary | ICD-10-CM

## 2018-07-07 MED ORDER — BUPROPION HCL ER (XL) 150 MG PO TB24: 150.0000 mg | ORAL_TABLET | Freq: Every day | ORAL | 5 refills | Status: DC

## 2018-07-07 MED ORDER — ATORVASTATIN CALCIUM 20 MG PO TABS: 20.0000 mg | ORAL_TABLET | Freq: Every day | ORAL | 1 refills | Status: DC

## 2018-09-12 ENCOUNTER — Encounter: Payer: Self-pay | Admitting: Physician Assistant

## 2018-09-13 ENCOUNTER — Other Ambulatory Visit: Payer: Self-pay

## 2018-09-13 ENCOUNTER — Ambulatory Visit (INDEPENDENT_AMBULATORY_CARE_PROVIDER_SITE_OTHER): Payer: 59 | Admitting: Physician Assistant

## 2018-09-13 ENCOUNTER — Encounter: Payer: Self-pay | Admitting: Physician Assistant

## 2018-09-13 VITALS — BP 98/64 | HR 74 | Temp 97.8°F | Resp 14 | Ht 69.0 in | Wt 251.0 lb

## 2018-09-13 DIAGNOSIS — F909 Attention-deficit hyperactivity disorder, unspecified type: Secondary | ICD-10-CM | POA: Diagnosis not present

## 2018-09-13 DIAGNOSIS — Z23 Encounter for immunization: Secondary | ICD-10-CM

## 2018-09-13 DIAGNOSIS — E785 Hyperlipidemia, unspecified: Secondary | ICD-10-CM | POA: Diagnosis not present

## 2018-09-13 DIAGNOSIS — Z0000 Encounter for general adult medical examination without abnormal findings: Secondary | ICD-10-CM

## 2018-09-13 DIAGNOSIS — B353 Tinea pedis: Secondary | ICD-10-CM | POA: Diagnosis not present

## 2018-09-13 LAB — CBC WITH DIFFERENTIAL/PLATELET
BASOS PCT: 0.8 % (ref 0.0–3.0)
Basophils Absolute: 0 10*3/uL (ref 0.0–0.1)
Eosinophils Absolute: 0.4 10*3/uL (ref 0.0–0.7)
Eosinophils Relative: 7.2 % — ABNORMAL HIGH (ref 0.0–5.0)
HCT: 42.2 % (ref 39.0–52.0)
Hemoglobin: 14.1 g/dL (ref 13.0–17.0)
LYMPHS PCT: 46.3 % — AB (ref 12.0–46.0)
Lymphs Abs: 2.3 10*3/uL (ref 0.7–4.0)
MCHC: 33.4 g/dL (ref 30.0–36.0)
MCV: 85.4 fl (ref 78.0–100.0)
Monocytes Absolute: 0.4 10*3/uL (ref 0.1–1.0)
Monocytes Relative: 9 % (ref 3.0–12.0)
NEUTROS PCT: 36.7 % — AB (ref 43.0–77.0)
Neutro Abs: 1.8 10*3/uL (ref 1.4–7.7)
PLATELETS: 283 10*3/uL (ref 150.0–400.0)
RBC: 4.95 Mil/uL (ref 4.22–5.81)
RDW: 13.9 % (ref 11.5–15.5)
WBC: 4.9 10*3/uL (ref 4.0–10.5)

## 2018-09-13 LAB — COMPREHENSIVE METABOLIC PANEL
ALT: 27 U/L (ref 0–53)
AST: 17 U/L (ref 0–37)
Albumin: 4.1 g/dL (ref 3.5–5.2)
Alkaline Phosphatase: 40 U/L (ref 39–117)
BUN: 14 mg/dL (ref 6–23)
CHLORIDE: 107 meq/L (ref 96–112)
CO2: 28 meq/L (ref 19–32)
Calcium: 9.7 mg/dL (ref 8.4–10.5)
Creatinine, Ser: 1.42 mg/dL (ref 0.40–1.50)
GFR: 72.28 mL/min (ref >60.00)
Glucose, Bld: 97 mg/dL (ref 70–99)
POTASSIUM: 4.3 meq/L (ref 3.5–5.1)
SODIUM: 143 meq/L (ref 135–145)
Total Bilirubin: 0.3 mg/dL (ref 0.2–1.2)
Total Protein: 6.8 g/dL (ref 6.0–8.3)

## 2018-09-13 LAB — HEMOGLOBIN A1C: Hgb A1c MFr Bld: 6.2 % (ref 4.6–6.5)

## 2018-09-13 LAB — LIPID PANEL
Cholesterol: 138 mg/dL (ref 0–200)
HDL: 23.4 mg/dL — AB (ref >39.00)
LDL CALC: 77 mg/dL (ref 0–99)
NonHDL: 114.8
TRIGLYCERIDES: 189 mg/dL — AB (ref 0.0–149.0)
Total CHOL/HDL Ratio: 6
VLDL: 37.8 mg/dL (ref 0.0–40.0)

## 2018-09-13 LAB — TSH: TSH: 1.61 u[IU]/mL (ref 0.35–4.50)

## 2018-09-13 NOTE — Assessment & Plan Note (Signed)
Supportive measures discussed. Start Lamisil OTC. Follow-up if not resolving.

## 2018-09-13 NOTE — Assessment & Plan Note (Signed)
Doing very well. Continue current regimen. Follow-up 6 months. 

## 2018-09-13 NOTE — Assessment & Plan Note (Signed)
Depression screen negative. Health Maintenance reviewed. Preventive schedule discussed and handout given in AVS. Will obtain fasting labs today.  

## 2018-09-13 NOTE — Assessment & Plan Note (Signed)
Flu shot updated today. 

## 2018-09-13 NOTE — Progress Notes (Signed)
Patient presents to clinic today for annual exam.  Patient is fasting for labs. Has not been exercising. Eating quick foods. Patient has gained almost 20 pounds since last visit 5 months ago.  Chronic Issues: ADHD -- Currently on Wellbutrin XL 150 mg daily. Is taking and tolerating well. Notes he is doing very well on this regimen.  Health Maintenance: Immunizations -- Tetanus up-to-date. Agrees to flu shot today.   Past Medical History:  Diagnosis Date  . Asthma    Childhood -- Resolved.  . Hyperlipemia   . Kidney stones 2012  . Kidney stones     Past Surgical History:  Procedure Laterality Date  . NO PAST SURGERIES      Current Outpatient Medications on File Prior to Visit  Medication Sig Dispense Refill  . atorvastatin (LIPITOR) 20 MG tablet Take 1 tablet (20 mg total) by mouth daily. 90 tablet 1  . buPROPion (WELLBUTRIN XL) 150 MG 24 hr tablet Take 1 tablet (150 mg total) by mouth daily. 30 tablet 5   No current facility-administered medications on file prior to visit.     Allergies  Allergen Reactions  . Sulfa Antibiotics Rash    Family History  Problem Relation Age of Onset  . Healthy Mother        Living  . Healthy Father        Living  . Diabetes Maternal Grandmother   . Alzheimer's disease Paternal Grandfather   . Stroke Paternal Grandmother   . Diabetes Maternal Uncle   . Healthy Brother     Social History   Socioeconomic History  . Marital status: Single    Spouse name: Not on file  . Number of children: Not on file  . Years of education: Not on file  . Highest education level: Not on file  Occupational History  . Not on file  Social Needs  . Financial resource strain: Not on file  . Food insecurity:    Worry: Not on file    Inability: Not on file  . Transportation needs:    Medical: Not on file    Non-medical: Not on file  Tobacco Use  . Smoking status: Never Smoker  . Smokeless tobacco: Never Used  Substance and Sexual Activity    . Alcohol use: Yes    Alcohol/week: 0.0 standard drinks    Comment: socially  . Drug use: No  . Sexual activity: Yes    Partners: Female  Lifestyle  . Physical activity:    Days per week: Not on file    Minutes per session: Not on file  . Stress: Not on file  Relationships  . Social connections:    Talks on phone: Not on file    Gets together: Not on file    Attends religious service: Not on file    Active member of club or organization: Not on file    Attends meetings of clubs or organizations: Not on file    Relationship status: Not on file  . Intimate partner violence:    Fear of current or ex partner: Not on file    Emotionally abused: Not on file    Physically abused: Not on file    Forced sexual activity: Not on file  Other Topics Concern  . Not on file  Social History Narrative  . Not on file   Review of Systems  Constitutional: Negative for fever and weight loss.  HENT: Negative for ear discharge, ear pain, hearing loss and  tinnitus.   Eyes: Negative for blurred vision, double vision, photophobia and pain.  Respiratory: Negative for cough and shortness of breath.   Cardiovascular: Negative for chest pain and palpitations.  Gastrointestinal: Negative for abdominal pain, blood in stool, constipation, diarrhea, heartburn, melena, nausea and vomiting.  Genitourinary: Negative for dysuria, flank pain, frequency, hematuria and urgency.  Musculoskeletal: Negative for falls.  Neurological: Negative for dizziness, loss of consciousness and headaches.  Endo/Heme/Allergies: Negative for environmental allergies.  Psychiatric/Behavioral: Negative for depression, hallucinations, substance abuse and suicidal ideas. The patient is not nervous/anxious and does not have insomnia.    BP 98/64   Pulse 74   Temp 97.8 F (36.6 C) (Oral)   Resp 14   Ht 5\' 9"  (1.753 m)   Wt 251 lb (113.9 kg)   SpO2 98%   BMI 37.07 kg/m   Physical Exam  Constitutional: He is oriented to person,  place, and time. He appears well-developed and well-nourished. No distress.  HENT:  Head: Normocephalic and atraumatic.  Right Ear: Tympanic membrane, external ear and ear canal normal.  Left Ear: Tympanic membrane, external ear and ear canal normal.  Nose: Nose normal.  Mouth/Throat: Oropharynx is clear and moist and mucous membranes are normal. No posterior oropharyngeal edema or posterior oropharyngeal erythema.  Eyes: Pupils are equal, round, and reactive to light. Conjunctivae are normal.  Neck: Neck supple. No thyromegaly present.  Cardiovascular: Normal rate, regular rhythm, normal heart sounds and intact distal pulses.  Pulmonary/Chest: Effort normal and breath sounds normal. No respiratory distress. He has no wheezes. He has no rales. He exhibits no tenderness.  Abdominal: Soft. Bowel sounds are normal. He exhibits no distension and no mass. There is no tenderness. There is no rebound and no guarding.  Lymphadenopathy:    He has no cervical adenopathy.  Neurological: He is alert and oriented to person, place, and time. No cranial nerve deficit.  Skin: Skin is warm and dry. No rash noted. He is not diaphoretic.     Psychiatric: He has a normal mood and affect.  Vitals reviewed.  Assessment/Plan: Visit for preventive health examination Depression screen negative. Health Maintenance reviewed. Preventive schedule discussed and handout given in AVS. Will obtain fasting labs today.   Need for immunization against influenza Flu shot updated today  Hyperlipidemia Has gained almost 20 pounds -- very sedentary lifestyle and poor diet.  Continue statin as directed. Will check fasting lipids.  Dietary and exercise recommendations reviewed.  ADHD Doing very well. Continue current regimen. Follow-up 6 months.   Tinea pedis of both feet Supportive measures discussed. Start Lamisil OTC. Follow-up if not resolving.     Piedad ClimesWilliam [name] Giavonna Pflum, PA-C

## 2018-09-13 NOTE — Assessment & Plan Note (Signed)
Has gained almost 20 pounds -- very sedentary lifestyle and poor diet.  Continue statin as directed. Will check fasting lipids.  Dietary and exercise recommendations reviewed.

## 2018-09-13 NOTE — Patient Instructions (Signed)
-Please go to the lab for blood work.  -Our office will call you with your results unless you have chosen to receive results via MyChart. -If your blood work is normal we will follow-up each year for physicals and as scheduled for chronic medical problems. -If anything is abnormal we will treat accordingly and get you in for a follow-up.  For the allergies, start daily OTC Xyzal (an antihistamine) and saline nasal rinses. For the yeast of feet, keep feet clean and dry. Wear cotton socks. Alternate shoes daily. Make sure to let shoes dry out well. Start OTC Lamisil cream to the feet twice daily x 2 weeks. If not improving, let me know.  Continue chronic medications as directed.  Make sure to get in 150 minutes of aerobic exercise per week.  Try to limit snacking between meals. Keep diet mostly of fruits, vegetables and lean proteins. Avoid processed foods as much as possible.   Follow-up in 6 months for reassessment of weight.    Preventive Care 18-39 Years, Male Preventive care refers to lifestyle choices and visits with your health care provider that can promote health and wellness. What does preventive care include?  A yearly physical exam. This is also called an annual well check.  Dental exams once or twice a year.  Routine eye exams. Ask your health care provider how often you should have your eyes checked.  Personal lifestyle choices, including: ? Daily care of your teeth and gums. ? Regular physical activity. ? Eating a healthy diet. ? Avoiding tobacco and drug use. ? Limiting alcohol use. ? Practicing safe sex. What happens during an annual well check? The services and screenings done by your health care provider during your annual well check will depend on your age, overall health, lifestyle risk factors, and family history of disease. Counseling Your health care provider may ask you questions about your:  Alcohol use.  Tobacco use.  Drug use.  Emotional  well-being.  Home and relationship well-being.  Sexual activity.  Eating habits.  Work and work Statistician.  Screening You may have the following tests or measurements:  Height, weight, and BMI.  Blood pressure.  Lipid and cholesterol levels. These may be checked every 5 years starting at age 30.  Diabetes screening. This is done by checking your blood sugar (glucose) after you have not eaten for a while (fasting).  Skin check.  Hepatitis C blood test.  Hepatitis B blood test.  Sexually transmitted disease (STD) testing.  Discuss your test results, treatment options, and if necessary, the need for more tests with your health care provider. Vaccines Your health care provider may recommend certain vaccines, such as:  Influenza vaccine. This is recommended every year.  Tetanus, diphtheria, and acellular pertussis (Tdap, Td) vaccine. You may need a Td booster every 10 years.  Varicella vaccine. You may need this if you have not been vaccinated.  HPV vaccine. If you are 36 or younger, you may need three doses over 6 months.  Measles, mumps, and rubella (MMR) vaccine. You may need at least one dose of MMR.You may also need a second dose.  Pneumococcal 13-valent conjugate (PCV13) vaccine. You may need this if you have certain conditions and have not been vaccinated.  Pneumococcal polysaccharide (PPSV23) vaccine. You may need one or two doses if you smoke cigarettes or if you have certain conditions.  Meningococcal vaccine. One dose is recommended if you are age 82-21 years and a Market researcher living in a residence hall,  or if you have one of several medical conditions. You may also need additional booster doses.  Hepatitis A vaccine. You may need this if you have certain conditions or if you travel or work in places where you may be exposed to hepatitis A.  Hepatitis B vaccine. You may need this if you have certain conditions or if you travel or work in  places where you may be exposed to hepatitis B.  Haemophilus influenzae type b (Hib) vaccine. You may need this if you have certain risk factors.  Talk to your health care provider about which screenings and vaccines you need and how often you need them. This information is not intended to replace advice given to you by your health care provider. Make sure you discuss any questions you have with your health care provider. Document Released: 02/06/2002 Document Revised: 08/30/2016 Document Reviewed: 10/12/2015 Elsevier Interactive Patient Education  [employer].

## 2018-09-28 ENCOUNTER — Encounter: Payer: Self-pay | Admitting: Physician Assistant

## 2019-06-04 ENCOUNTER — Encounter: Payer: Self-pay | Admitting: Emergency Medicine

## 2019-06-04 ENCOUNTER — Other Ambulatory Visit: Payer: Self-pay | Admitting: Physician Assistant

## 2019-06-04 NOTE — Telephone Encounter (Signed)
My chart message sent to patient to schedule a virtual visit to follow up on ADHD and refill medication

## 2019-06-25 ENCOUNTER — Other Ambulatory Visit: Payer: Self-pay | Admitting: Physician Assistant

## 2019-06-25 DIAGNOSIS — E785 Hyperlipidemia, unspecified: Secondary | ICD-10-CM

## 2019-06-26 ENCOUNTER — Encounter: Payer: Self-pay | Admitting: Emergency Medicine

## 2019-06-26 NOTE — Telephone Encounter (Signed)
Patient is due for 6 month follow up on Cholesterol. Needs an appointment

## 2019-07-09 ENCOUNTER — Other Ambulatory Visit: Payer: Self-pay | Admitting: Physician Assistant

## 2019-08-19 ENCOUNTER — Other Ambulatory Visit: Payer: Self-pay | Admitting: Physician Assistant

## 2019-09-24 ENCOUNTER — Other Ambulatory Visit: Payer: Self-pay

## 2019-09-24 NOTE — Telephone Encounter (Signed)
Patient called in requesting 30 day supply, last refill denied due to needing f/u. Has CPE scheduled early November

## 2019-10-31 ENCOUNTER — Encounter: Payer: 59 | Admitting: Physician Assistant
